# Patient Record
Sex: Female | Born: 1955 | Race: Black or African American | Hispanic: No | Marital: Single
Health system: Southern US, Community
[De-identification: ages and names within clinical notes are randomized; demographics above are authoritative.]

## PROBLEM LIST (undated history)

## (undated) DIAGNOSIS — E569 Vitamin deficiency, unspecified: Secondary | ICD-10-CM

## (undated) DIAGNOSIS — M79609 Pain in unspecified limb: Secondary | ICD-10-CM

## (undated) DIAGNOSIS — Z9889 Other specified postprocedural states: Secondary | ICD-10-CM

## (undated) DIAGNOSIS — Z8619 Personal history of other infectious and parasitic diseases: Secondary | ICD-10-CM

## (undated) DIAGNOSIS — M2550 Pain in unspecified joint: Secondary | ICD-10-CM

## (undated) DIAGNOSIS — B009 Herpesviral infection, unspecified: Secondary | ICD-10-CM

## (undated) DIAGNOSIS — E7800 Pure hypercholesterolemia, unspecified: Secondary | ICD-10-CM

## (undated) DIAGNOSIS — I83893 Varicose veins of bilateral lower extremities with other complications: Secondary | ICD-10-CM

## (undated) DIAGNOSIS — G4733 Obstructive sleep apnea (adult) (pediatric): Secondary | ICD-10-CM

## (undated) HISTORY — DX: Other specified postprocedural states: Z98.890

## (undated) HISTORY — DX: Obstructive sleep apnea (adult) (pediatric): G47.33

## (undated) HISTORY — PX: BUNIONECTOMY: SHX129

## (undated) HISTORY — DX: Herpesviral infection, unspecified: B00.9

## (undated) HISTORY — DX: Personal history of other infectious and parasitic diseases: Z86.19

## (undated) HISTORY — DX: Pain in unspecified joint: M25.50

## (undated) HISTORY — PX: BREAST SURGERY: SHX581

## (undated) HISTORY — PX: KNEE ARTHROSCOPY: SUR90

## (undated) HISTORY — DX: Varicose veins of bilateral lower extremities with other complications: I83.893

## (undated) HISTORY — DX: Pure hypercholesterolemia, unspecified: E78.00

## (undated) HISTORY — DX: Pain in unspecified limb: M79.609

## (undated) HISTORY — PX: CARPAL TUNNEL RELEASE: SHX101

## (undated) HISTORY — DX: Vitamin deficiency, unspecified: E56.9

---

## 2006-07-11 ENCOUNTER — Emergency Department (HOSPITAL_COMMUNITY): Admission: EM | Admit: 2006-07-11 | Discharge: 2006-07-12 | Payer: Self-pay | Admitting: Emergency Medicine

## 2009-06-20 ENCOUNTER — Encounter: Admission: RE | Admit: 2009-06-20 | Discharge: 2009-06-20 | Payer: Self-pay | Admitting: Family Medicine

## 2010-02-06 HISTORY — PX: HAND SURGERY: SHX662

## 2010-02-26 ENCOUNTER — Ambulatory Visit
Admission: RE | Admit: 2010-02-26 | Discharge: 2010-02-26 | Payer: Self-pay | Source: Home / Self Care | Attending: Orthopedic Surgery | Admitting: Orthopedic Surgery

## 2010-05-13 LAB — POCT HEMOGLOBIN-HEMACUE: Hemoglobin: 13.2 g/dL (ref 12.0–15.0)

## 2010-06-19 ENCOUNTER — Other Ambulatory Visit: Payer: Self-pay | Admitting: Family Medicine

## 2010-06-19 DIAGNOSIS — Z1231 Encounter for screening mammogram for malignant neoplasm of breast: Secondary | ICD-10-CM

## 2010-06-24 ENCOUNTER — Ambulatory Visit
Admission: RE | Admit: 2010-06-24 | Discharge: 2010-06-24 | Disposition: A | Discharge status: Home / Self Care | Payer: Federal, State, Local not specified - PPO | Source: Ambulatory Visit | Attending: Family Medicine | Admitting: Family Medicine

## 2010-06-24 DIAGNOSIS — Z1231 Encounter for screening mammogram for malignant neoplasm of breast: Secondary | ICD-10-CM

## 2010-11-08 ENCOUNTER — Encounter: Payer: Self-pay | Admitting: Vascular Surgery

## 2010-11-11 ENCOUNTER — Other Ambulatory Visit: Payer: Self-pay

## 2010-11-11 ENCOUNTER — Ambulatory Visit (INDEPENDENT_AMBULATORY_CARE_PROVIDER_SITE_OTHER): Payer: Federal, State, Local not specified - PPO | Admitting: Vascular Surgery

## 2010-11-11 ENCOUNTER — Encounter: Payer: Self-pay | Admitting: Vascular Surgery

## 2010-11-11 VITALS — BP 117/71 | HR 75 | Resp 20 | Ht 63.0 in | Wt 198.5 lb

## 2010-11-11 DIAGNOSIS — I83893 Varicose veins of bilateral lower extremities with other complications: Secondary | ICD-10-CM

## 2010-11-11 NOTE — Progress Notes (Signed)
Subjective:     Patient ID: Catherine Parker, female   DOB: 1955/09/12, 55 y.o.   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 She has previously had an evaluation many years ago in [place] with ultrasound studies which revealed reflux in both legs. She had some sclerotherapy performed at that time. She has had recurrence of extensive reticular and spider veins in both lower extremities left greater than right. These are mainly in her lateral thigh and calf areas tending down to the ankles. She developed swelling in both legs left worse than right as the day progresses. She also has an no history of thrombophlebitis DVT stasis ulcers bleeding or other complications. She has worn elastic compression stockings in the past without improvement but is not currently wearing them. Elevation helps her symptoms but she cannot do that at work as she is on her feet all day. She does not take pain medication. He describes her symptoms as a throbbing burning discomfort which worsens as the day progresses.  Past Medical History  Diagnosis Date  . Pain in limb   . Varicose veins of lower extremities with other complications   . Joint ache     History  Substance Use Topics  . Smoking status: Never Smoker   . Smokeless tobacco: Not on file  . Alcohol Use: Yes    History reviewed. No pertinent family history.  Allergies  Allergen Reactions  . Latex   . Penicillins     Current outpatient prescriptions:liver oil-zinc oxide (DESITIN) 40 % ointment, Apply topically as needed.  , Disp: , Rfl: ;  Vitamin D, Ergocalciferol, (DRISDOL) 50000 UNITS CAPS, Take 50,000 Units by mouth.  , Disp: , Rfl: ;  zolpidem (AMBIEN) 10 MG tablet, Take 10 mg by mouth at bedtime as needed.  , Disp: , Rfl:   BP 117/71  Pulse 75  Resp 20  Ht 5\' 3"  (1.6 m)  Wt 198 lb 8 oz (90.039 kg)  BMI 35.16 kg/m2  Body mass index is 35.16 kg/(m^2).         Review  of Systems she denies chest pain dyspnea on exertion PND orthopnea. She has had some weight gain as well as joint pain and leg discomfort with ambulation all other systems are negative the complete review of systems.     Objective:   Physical Exam blood pressure 170/71 heart rate 75 respirations 20 General she is well-developed well-nourished female no apparent distress HEENT exam normal for age Chest clear to ossicle patient no rhonchi or wheezing Cardiovascular regular rhythm no murmurs Abdomen soft nontender no masses palpable Neurologic exam normal Musculoskeletal from deformities Lower extremity exam reveals 3+ femoral popliteal dorsalis pedis pulses palpable bilaterally. She has diffuse pattern reticular veins particularly in the left lateral thigh and right lateral thigh extending posteriorly and distally down the lateral calf areas. She has 1+ edema bilaterally. There is no hyperpigmentation or ulceration noted.     Assessment:    evidence of severe bilateral venous insufficiency with diffuse reticular and spider veins and previous history of reflux.    Plan:    Will schedule bilateral venous reflux exam. We will look for valvular incompetence in the greater and small saphenous systems. If she does have reflux we will begin long-leg elastic compression stocking therapy (20 mm-30 mm gradient) We will also begin elevation of her legs when she is not working intermittently. She'll also begin ibuprofen therapy. Treatment will depend on the  findings of the venous duplex study to be done in the near future

## 2010-11-12 ENCOUNTER — Other Ambulatory Visit (INDEPENDENT_AMBULATORY_CARE_PROVIDER_SITE_OTHER): Payer: Federal, State, Local not specified - PPO | Admitting: *Deleted

## 2010-11-12 DIAGNOSIS — I83893 Varicose veins of bilateral lower extremities with other complications: Secondary | ICD-10-CM

## 2010-11-27 NOTE — Procedures (Unsigned)
LOWER EXTREMITY VENOUS REFLUX EXAM  INDICATION:  Varicose veins.  EXAM:  Using color-flow imaging and pulse Doppler spectral analysis, the bilateral common femoral, superficial femoral, popliteal, posterior tibial, greater and lesser saphenous veins are evaluated.  There is no evidence suggesting deep venous insufficiency in the bilateral lower extremities.  The left saphenofemoral junction is not competent with reflux of >532milliseconds. However, the remainder of the left great saphenous vein is competent.  The right GSV is competent.  The bilateral proximal short saphenous vein demonstrates competency.  GSV Diameter (used if found to be incompetent only)                                           Right    Left Proximal Greater Saphenous Vein           cm       cm Proximal-to-mid-thigh                     cm       cm Mid thigh                                 cm       cm Mid-distal thigh                          cm       cm Distal thigh                              cm       cm Knee                                      cm       cm  IMPRESSION: 1. Reflux of >500 milliseconds at the left saphenofemoral junction     with the remainder of the saphenous vein being competent. 2. No insufficiency observed in the right greater saphenous vein. 3. The deep venous system is competent. 4. The bilateral small saphenous veins are competent.  ___________________________________________ Quita Skye. Hart Rochester, M.D.  LT/MEDQ  D:  11/12/2010  T:  11/12/2010  Job:  [id number]

## 2011-06-16 ENCOUNTER — Other Ambulatory Visit: Payer: Self-pay | Admitting: Family Medicine

## 2011-06-16 DIAGNOSIS — Z1231 Encounter for screening mammogram for malignant neoplasm of breast: Secondary | ICD-10-CM

## 2011-06-26 ENCOUNTER — Ambulatory Visit
Admission: RE | Admit: 2011-06-26 | Discharge: 2011-06-26 | Disposition: A | Discharge status: Home / Self Care | Payer: Federal, State, Local not specified - PPO | Source: Ambulatory Visit | Attending: Family Medicine | Admitting: Family Medicine

## 2011-06-26 DIAGNOSIS — Z1231 Encounter for screening mammogram for malignant neoplasm of breast: Secondary | ICD-10-CM

## 2011-06-30 ENCOUNTER — Ambulatory Visit (INDEPENDENT_AMBULATORY_CARE_PROVIDER_SITE_OTHER): Payer: Federal, State, Local not specified - PPO | Admitting: Obstetrics and Gynecology

## 2011-06-30 ENCOUNTER — Encounter: Payer: Self-pay | Admitting: Obstetrics and Gynecology

## 2011-06-30 VITALS — BP 116/74 | HR 72 | Ht 63.0 in | Wt 203.0 lb

## 2011-06-30 DIAGNOSIS — I83893 Varicose veins of bilateral lower extremities with other complications: Secondary | ICD-10-CM

## 2011-06-30 DIAGNOSIS — B009 Herpesviral infection, unspecified: Secondary | ICD-10-CM | POA: Insufficient documentation

## 2011-06-30 DIAGNOSIS — M79609 Pain in unspecified limb: Secondary | ICD-10-CM

## 2011-06-30 DIAGNOSIS — M2550 Pain in unspecified joint: Secondary | ICD-10-CM

## 2011-06-30 DIAGNOSIS — Z124 Encounter for screening for malignant neoplasm of cervix: Secondary | ICD-10-CM

## 2011-06-30 DIAGNOSIS — Z01419 Encounter for gynecological examination (general) (routine) without abnormal findings: Secondary | ICD-10-CM

## 2011-06-30 NOTE — Patient Instructions (Signed)
Follow up with Dr. Zachery Dauer for fatigue and to have your vitamin D, thyroid and iron evaluated.

## 2011-06-30 NOTE — Progress Notes (Signed)
Subjective:    Catherine Parker is a 56 y.o. female, G4P0040, who presents for an annual exam. The patient complains of being tired all the time for the past several months.  Plans to see PCP soon.  Advised to have vitamin D checked.    History   Social History  . Marital Status: Single    Spouse Name: N/A    Number of Children: N/A  . Years of Education: N/A   Social History Main Topics  . Smoking status: Never Smoker   . Smokeless tobacco: Never Used  . Alcohol Use: Yes     socially  . Drug Use: No  . Sexually Active: Yes -- Female partner(s)    Birth Control/ Protection: IUD     MIRENA   Other Topics Concern  . None   Social History Narrative  . None    Last mammogram: was normal  2013 Last pap: was normal  2012  Menstrual cycle:   LMP: No LMP recorded. Patient is not currently having periods (Reason: IUD).             Review of Systems Pertinent items are noted in HPI. Denies pelvic pain, uti symptoms, vaginitis symptoms, irregular bleeding, menopausal symptoms or rectal bleeding   Objective:    BP 116/74  Pulse 72  Ht 5\' 3"  (1.6 m)  Wt 203 lb (92.08 kg)  BMI 35.96 kg/m2 Weight:  Wt Readings from Last 1 Encounters:  06/30/11 203 lb (92.08 kg)   BMI: Body mass index is 35.96 kg/(m^2). General Appearance: Alert, appropriate appearance for age. No acute distress HEENT: Grossly normal Neck / Thyroid: Supple, no masses, nodes or enlargement Lungs: clear to auscultation bilaterally Back: No CVA tenderness Breast Exam: No masses or nodes.No dimpling, nipple retraction or discharge. Cardiovascular: Regular rate and rhythm. S1, S2, no murmur Gastrointestinal: Soft, non-tender, no masses or organomegaly Pelvic Exam: External genitalia: normal general appearance Urinary system:  Cervix: normal appearance Adnexa: normal bimanual exam Uterus: normal single, nontender Rectal: good sphincter tone and no masses Rectovaginal: normal rectal, no masses Lymphatic  Exam: Non-palpable nodes in neck, clavicular, axillary, or inguinal regions Skin: no rash or abnormalities Extremities: no redness or tenderness in the calves or thighs, no edema negative Homan's  Neurologic: grossly normal Psychiatric: Alert and oriented, appropriate affect.   Wet Prep:not applicable Urinalysis:not applicable UPT: Not done   Assessment:    Normal gyn exam Fatigue    Plan:  Follow up with Dr. Zachery Dauer for evaluation of fatigue May order iron, vitamin D & thyroid studies  mammogram pap smear return annually or prn Prescription given for Boric Acid Capsules 600 mg #30  1 in vagina twice weekly prn  11 refills   Kemyra August,ELMIRAPA-C

## 2011-06-30 NOTE — Progress Notes (Signed)
Last Pap: 07/05/10 WNL: Yes Regular Periods:no IUD Monthly Breast exam:yes Tetanus<37yrs:no Nl.Bladder Function:yes Daily BMs:yes Healthy Diet:yes Calcium:yes Mammogram:yes  06/24/2011 Exercise:no Seatbelt: yes Abuse at home: no Stressful work:yes Sigmoid-colonoscopy:AGE 56    NL    BONE SCAN 10 YEARS AGO

## 2011-07-02 LAB — PAP IG W/ RFLX HPV ASCU

## 2011-07-21 ENCOUNTER — Other Ambulatory Visit: Payer: Self-pay | Admitting: Family Medicine

## 2011-07-21 DIAGNOSIS — M79669 Pain in unspecified lower leg: Secondary | ICD-10-CM

## 2011-07-22 ENCOUNTER — Ambulatory Visit
Admission: RE | Admit: 2011-07-22 | Discharge: 2011-07-22 | Disposition: A | Discharge status: Home / Self Care | Payer: Federal, State, Local not specified - PPO | Source: Ambulatory Visit | Attending: Family Medicine | Admitting: Family Medicine

## 2011-07-22 DIAGNOSIS — M79669 Pain in unspecified lower leg: Secondary | ICD-10-CM

## 2011-08-25 ENCOUNTER — Telehealth: Payer: Self-pay | Admitting: Obstetrics and Gynecology

## 2011-08-25 ENCOUNTER — Other Ambulatory Visit: Payer: Self-pay | Admitting: Obstetrics and Gynecology

## 2011-08-25 NOTE — Telephone Encounter (Signed)
Triage.

## 2011-08-25 NOTE — Telephone Encounter (Signed)
TC TO PT REGARDING MSG, LM ON VM TO CALL BACK.

## 2011-08-26 ENCOUNTER — Other Ambulatory Visit: Payer: Self-pay | Admitting: Obstetrics and Gynecology

## 2011-08-26 MED ORDER — AMBULATORY NON FORMULARY MEDICATION: 1.0000 | Status: DC

## 2011-08-26 NOTE — Telephone Encounter (Signed)
PT RTND CALL, STATES LOST RX THAT WAS GIVEN TO HER IN 06/2011 FOR HER BORIC ACID SUPPOSITORIES, ?'S IF IT CAN BE CALLED IN TO [facility], INFORMED SHOULD NOT BE A PROBLEM, WILL MAKE EP AWARE.

## 2012-02-11 ENCOUNTER — Encounter: Payer: Federal, State, Local not specified - PPO | Admitting: Obstetrics and Gynecology

## 2012-02-13 ENCOUNTER — Encounter: Payer: Self-pay | Admitting: Obstetrics and Gynecology

## 2012-02-13 ENCOUNTER — Ambulatory Visit (INDEPENDENT_AMBULATORY_CARE_PROVIDER_SITE_OTHER): Payer: Federal, State, Local not specified - PPO | Admitting: Obstetrics and Gynecology

## 2012-02-13 VITALS — BP 120/80 | Ht 63.0 in

## 2012-02-13 DIAGNOSIS — Z124 Encounter for screening for malignant neoplasm of cervix: Secondary | ICD-10-CM | POA: Insufficient documentation

## 2012-02-13 DIAGNOSIS — N898 Other specified noninflammatory disorders of vagina: Secondary | ICD-10-CM

## 2012-02-13 LAB — POCT WET PREP (WET MOUNT)
Bacteria Wet Prep HPF POC: NEGATIVE
KOH Wet Prep POC: NEGATIVE
Trichomonas Wet Prep HPF POC: NEGATIVE
WBC, Wet Prep HPF POC: NEGATIVE

## 2012-02-13 NOTE — Progress Notes (Signed)
S: pt worried about vag d/c, also concerned about being told she had herpes, but doesn't know what the lesion is, and worried about passing it to her partner  O: wet prep neg, normal vaginal flora No evidence of herpes lesion  A: normal gyn exam  P: discussed w pt, difference b/w herpes and other skin problems Reassured pt about normal discharge RTO prn or for annual exam

## 2012-02-19 ENCOUNTER — Ambulatory Visit
Admission: RE | Admit: 2012-02-19 | Discharge: 2012-02-19 | Disposition: A | Discharge status: Home / Self Care | Payer: Federal, State, Local not specified - PPO | Source: Ambulatory Visit | Attending: Family Medicine | Admitting: Family Medicine

## 2012-02-19 ENCOUNTER — Other Ambulatory Visit: Payer: Self-pay | Admitting: Family Medicine

## 2012-02-19 DIAGNOSIS — M79606 Pain in leg, unspecified: Secondary | ICD-10-CM

## 2012-02-19 DIAGNOSIS — M7989 Other specified soft tissue disorders: Secondary | ICD-10-CM

## 2012-03-02 ENCOUNTER — Ambulatory Visit (INDEPENDENT_AMBULATORY_CARE_PROVIDER_SITE_OTHER): Payer: Federal, State, Local not specified - PPO | Admitting: Obstetrics and Gynecology

## 2012-03-02 ENCOUNTER — Telehealth: Payer: Self-pay | Admitting: Obstetrics and Gynecology

## 2012-03-02 ENCOUNTER — Encounter: Payer: Self-pay | Admitting: Obstetrics and Gynecology

## 2012-03-02 VITALS — BP 120/84 | Resp 14 | Wt 205.0 lb

## 2012-03-02 DIAGNOSIS — L989 Disorder of the skin and subcutaneous tissue, unspecified: Secondary | ICD-10-CM

## 2012-03-02 DIAGNOSIS — B009 Herpesviral infection, unspecified: Secondary | ICD-10-CM

## 2012-03-02 NOTE — Telephone Encounter (Signed)
Tc to pt regarding message. Pt states she have a rash on her behind and want to be evaluated. Pt question if it is a hsv outbreak. Appt scheduled today with VL. Pt voiced understanding.

## 2012-03-03 NOTE — Progress Notes (Signed)
Here for evaluation of "rash on bottom".  Started several days ago--not painful. 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  Possible outbreaks occasionally since then, per hard chart, but patient doesn't remember those occurences. Seen 12/13 by SL for vaginal d/c--had questions at that time about HSV history and usual course of disease. Has a partner, but "no activity since I found out I had herpes".  Admits to being obsessive about cleanliness.  PE: Pustular lesions on right perineum/inner thigh.  NT. HSV culture done.  Imp:  Likely HSV outbreak  Plan: Patient to start Valtrex 500 mg po BID x 3 day course (already has medication at home). Reviewed s/s of HSV prodrome and lesions--if patient experiences more frequent outbreaks, can start suppressive therapy with Valtrex 500 po q day. Long discussion with patient about HSV, safe sex behaviors, etc.   F/u prn.

## 2012-03-05 ENCOUNTER — Telehealth: Payer: Self-pay

## 2012-03-05 NOTE — Telephone Encounter (Signed)
Spoke with pt regarding +HSV 2 test results. Pt is currently taking Valtrex 500 mg po bid for 3 days. Pt needs another Valtrex rx. Valtrex called to [facility] on [address] until next AEX per protocol. Pt doesn't want generic. Pt agrees and voices understanding.

## 2012-03-09 ENCOUNTER — Other Ambulatory Visit: Payer: Self-pay | Admitting: Orthopedic Surgery

## 2012-03-09 ENCOUNTER — Encounter (HOSPITAL_BASED_OUTPATIENT_CLINIC_OR_DEPARTMENT_OTHER): Payer: Self-pay | Admitting: *Deleted

## 2012-03-09 NOTE — Progress Notes (Signed)
No labs needed

## 2012-03-12 ENCOUNTER — Ambulatory Visit (HOSPITAL_BASED_OUTPATIENT_CLINIC_OR_DEPARTMENT_OTHER)
Admission: RE | Admit: 2012-03-12 | Discharge: 2012-03-12 | Disposition: A | Discharge status: Home / Self Care | Payer: Federal, State, Local not specified - PPO | Source: Ambulatory Visit | Attending: Orthopedic Surgery | Admitting: Orthopedic Surgery

## 2012-03-12 ENCOUNTER — Encounter (HOSPITAL_BASED_OUTPATIENT_CLINIC_OR_DEPARTMENT_OTHER): Payer: Self-pay | Admitting: Anesthesiology

## 2012-03-12 ENCOUNTER — Ambulatory Visit (HOSPITAL_BASED_OUTPATIENT_CLINIC_OR_DEPARTMENT_OTHER): Payer: Federal, State, Local not specified - PPO | Admitting: Anesthesiology

## 2012-03-12 ENCOUNTER — Encounter (HOSPITAL_BASED_OUTPATIENT_CLINIC_OR_DEPARTMENT_OTHER)
Admission: RE | Disposition: A | Discharge status: Home / Self Care | Payer: Self-pay | Source: Ambulatory Visit | Attending: Orthopedic Surgery

## 2012-03-12 ENCOUNTER — Encounter (HOSPITAL_BASED_OUTPATIENT_CLINIC_OR_DEPARTMENT_OTHER): Payer: Self-pay | Admitting: *Deleted

## 2012-03-12 DIAGNOSIS — Z880 Allergy status to penicillin: Secondary | ICD-10-CM | POA: Insufficient documentation

## 2012-03-12 DIAGNOSIS — E7800 Pure hypercholesterolemia, unspecified: Secondary | ICD-10-CM | POA: Insufficient documentation

## 2012-03-12 DIAGNOSIS — M2240 Chondromalacia patellae, unspecified knee: Secondary | ICD-10-CM | POA: Insufficient documentation

## 2012-03-12 DIAGNOSIS — Z91013 Allergy to seafood: Secondary | ICD-10-CM | POA: Insufficient documentation

## 2012-03-12 DIAGNOSIS — Z8249 Family history of ischemic heart disease and other diseases of the circulatory system: Secondary | ICD-10-CM | POA: Insufficient documentation

## 2012-03-12 DIAGNOSIS — M23329 Other meniscus derangements, posterior horn of medial meniscus, unspecified knee: Secondary | ICD-10-CM | POA: Insufficient documentation

## 2012-03-12 DIAGNOSIS — Z833 Family history of diabetes mellitus: Secondary | ICD-10-CM | POA: Insufficient documentation

## 2012-03-12 DIAGNOSIS — M23349 Other meniscus derangements, anterior horn of lateral meniscus, unspecified knee: Secondary | ICD-10-CM | POA: Insufficient documentation

## 2012-03-12 DIAGNOSIS — Z91040 Latex allergy status: Secondary | ICD-10-CM | POA: Insufficient documentation

## 2012-03-12 DIAGNOSIS — Z809 Family history of malignant neoplasm, unspecified: Secondary | ICD-10-CM | POA: Insufficient documentation

## 2012-03-12 HISTORY — PX: KNEE ARTHROSCOPY WITH MEDIAL MENISECTOMY: SHX5651

## 2012-03-12 HISTORY — PX: CHONDROPLASTY: SHX5177

## 2012-03-12 HISTORY — PX: KNEE ARTHROSCOPY WITH LATERAL MENISECTOMY: SHX6193

## 2012-03-12 SURGERY — ARTHROSCOPY, KNEE, WITH LATERAL MENISCECTOMY
Anesthesia: General | Site: Knee | Laterality: Left | Wound class: Clean

## 2012-03-12 MED ORDER — SODIUM CHLORIDE 0.9 % IR SOLN
Status: DC | PRN
  Administered 2012-03-12: 10:00:00

## 2012-03-12 MED ORDER — POVIDONE-IODINE 7.5 % EX SOLN: Freq: Once | CUTANEOUS | Status: DC

## 2012-03-12 MED ORDER — FENTANYL CITRATE 0.05 MG/ML IJ SOLN
INTRAMUSCULAR | Status: DC | PRN
  Administered 2012-03-12: 25 ug via INTRAVENOUS
  Administered 2012-03-12: 50 ug via INTRAVENOUS
  Administered 2012-03-12: 25 ug via INTRAVENOUS

## 2012-03-12 MED ORDER — ACETAMINOPHEN 10 MG/ML IV SOLN
1000.0000 mg | Freq: Once | INTRAVENOUS | Status: AC
  Administered 2012-03-12: 1000 mg via INTRAVENOUS

## 2012-03-12 MED ORDER — HYDROMORPHONE HCL PF 1 MG/ML IJ SOLN
0.2500 mg | INTRAMUSCULAR | Status: DC | PRN
  Administered 2012-03-12 (×2): 0.5 mg via INTRAVENOUS
  Administered 2012-03-12: 0.25 mg via INTRAVENOUS
  Administered 2012-03-12: 0.5 mg via INTRAVENOUS

## 2012-03-12 MED ORDER — ONDANSETRON HCL 4 MG/2ML IJ SOLN
INTRAMUSCULAR | Status: DC | PRN
  Administered 2012-03-12: 4 mg via INTRAVENOUS

## 2012-03-12 MED ORDER — OXYCODONE HCL 5 MG/5ML PO SOLN: 5.0000 mg | Freq: Once | ORAL | Status: DC | PRN

## 2012-03-12 MED ORDER — PROPOFOL 10 MG/ML IV BOLUS
INTRAVENOUS | Status: DC | PRN
  Administered 2012-03-12 (×2): 150 mg via INTRAVENOUS

## 2012-03-12 MED ORDER — LACTATED RINGERS IV SOLN
INTRAVENOUS | Status: DC
  Administered 2012-03-12: 09:00:00 via INTRAVENOUS

## 2012-03-12 MED ORDER — LIDOCAINE HCL (CARDIAC) 20 MG/ML IV SOLN
INTRAVENOUS | Status: DC | PRN
  Administered 2012-03-12: 100 mg via INTRAVENOUS

## 2012-03-12 MED ORDER — OXYCODONE HCL 5 MG PO TABS: 5.0000 mg | ORAL_TABLET | Freq: Once | ORAL | Status: DC | PRN

## 2012-03-12 MED ORDER — DEXAMETHASONE SODIUM PHOSPHATE 4 MG/ML IJ SOLN
INTRAMUSCULAR | Status: DC | PRN
  Administered 2012-03-12: 10 mg via INTRAVENOUS

## 2012-03-12 MED ORDER — MIDAZOLAM HCL 5 MG/5ML IJ SOLN
INTRAMUSCULAR | Status: DC | PRN
  Administered 2012-03-12: 1 mg via INTRAVENOUS

## 2012-03-12 MED ORDER — CLINDAMYCIN PHOSPHATE 900 MG/50ML IV SOLN
900.0000 mg | INTRAVENOUS | Status: AC
  Administered 2012-03-12 (×2): 900 mg via INTRAVENOUS

## 2012-03-12 MED ORDER — BUPIVACAINE HCL (PF) 0.5 % IJ SOLN
INTRAMUSCULAR | Status: DC | PRN
  Administered 2012-03-12: 20 mL

## 2012-03-12 MED ORDER — OXYCODONE-ACETAMINOPHEN 5-325 MG PO TABS: 1.0000 | ORAL_TABLET | Freq: Four times a day (QID) | ORAL | Status: DC | PRN

## 2012-03-12 SURGICAL SUPPLY — 43 items
BANDAGE ELASTIC 6 VELCRO ST LF (GAUZE/BANDAGES/DRESSINGS) ×2 IMPLANT
BLADE 4.2CUDA (BLADE) IMPLANT
BLADE GREAT WHITE 4.2 (BLADE) ×2 IMPLANT
CANISTER OMNI JUG 16 LITER (MISCELLANEOUS) ×2 IMPLANT
CANISTER SUCTION 2500CC (MISCELLANEOUS) IMPLANT
CLOTH BEACON ORANGE TIMEOUT ST (SAFETY) ×2 IMPLANT
CUTTER MENISCUS  4.2MM (BLADE)
CUTTER MENISCUS 4.2MM (BLADE) IMPLANT
DRAPE ARTHROSCOPY W/POUCH 114 (DRAPES) ×2 IMPLANT
DRSG EMULSION OIL 3X3 NADH (GAUZE/BANDAGES/DRESSINGS) ×2 IMPLANT
DURAPREP 26ML APPLICATOR (WOUND CARE) ×2 IMPLANT
ELECT MENISCUS 165MM 90D (ELECTRODE) IMPLANT
ELECT REM PT RETURN 9FT ADLT (ELECTROSURGICAL) ×2
ELECTRODE REM PT RTRN 9FT ADLT (ELECTROSURGICAL) IMPLANT
GLOVE BIOGEL PI IND STRL 8 (GLOVE) ×2 IMPLANT
GLOVE BIOGEL PI INDICATOR 8 (GLOVE)
GLOVE ECLIPSE 7.5 STRL STRAW (GLOVE) ×2 IMPLANT
GLOVE SKINSENSE NS SZ6.5 (GLOVE) ×1
GLOVE SKINSENSE NS SZ7.0 (GLOVE) ×1
GLOVE SKINSENSE NS SZ7.5 (GLOVE) ×3
GLOVE SKINSENSE STRL SZ6.5 (GLOVE) IMPLANT
GLOVE SKINSENSE STRL SZ7.0 (GLOVE) IMPLANT
GLOVE SKINSENSE STRL SZ7.5 (GLOVE) IMPLANT
GOWN BRE IMP PREV XXLGXLNG (GOWN DISPOSABLE) ×2 IMPLANT
GOWN PREVENTION PLUS XLARGE (GOWN DISPOSABLE) ×2 IMPLANT
GOWN PREVENTION PLUS XXLARGE (GOWN DISPOSABLE) ×4 IMPLANT
HOLDER KNEE FOAM BLUE (MISCELLANEOUS) ×2 IMPLANT
KNEE WRAP E Z 3 GEL PACK (MISCELLANEOUS) ×2 IMPLANT
NDL SAFETY ECLIPSE 18X1.5 (NEEDLE) IMPLANT
NEEDLE HYPO 18GX1.5 SHARP (NEEDLE)
PACK ARTHROSCOPY DSU (CUSTOM PROCEDURE TRAY) ×2 IMPLANT
PACK BASIN DAY SURGERY FS (CUSTOM PROCEDURE TRAY) ×2 IMPLANT
PAD CAST 4YDX4 CTTN HI CHSV (CAST SUPPLIES) ×1 IMPLANT
PADDING CAST COTTON 4X4 STRL (CAST SUPPLIES) ×2
PENCIL BUTTON HOLSTER BLD 10FT (ELECTRODE) IMPLANT
SET ARTHROSCOPY TUBING (MISCELLANEOUS) ×2
SET ARTHROSCOPY TUBING LN (MISCELLANEOUS) ×1 IMPLANT
SPONGE GAUZE 4X4 12PLY (GAUZE/BANDAGES/DRESSINGS) ×2 IMPLANT
SUT ETHILON 4 0 PS 2 18 (SUTURE) IMPLANT
SYR 5ML LL (SYRINGE) ×2 IMPLANT
TOWEL OR 17X24 6PK STRL BLUE (TOWEL DISPOSABLE) ×2 IMPLANT
TOWEL OR NON WOVEN STRL DISP B (DISPOSABLE) IMPLANT
WATER STERILE IRR 1000ML POUR (IV SOLUTION) ×2 IMPLANT

## 2012-03-12 NOTE — Anesthesia Postprocedure Evaluation (Signed)
  Anesthesia Post-op Note  Patient: Doloris Amon  Procedure(s) Performed: Procedure(s) (LRB) with comments: KNEE ARTHROSCOPY WITH LATERAL MENISECTOMY (Left) KNEE ARTHROSCOPY WITH MEDIAL MENISECTOMY (Left) CHONDROPLASTY (Left)  Patient Location: PACU  Anesthesia Type:General  Level of Consciousness: awake and alert   Airway and Oxygen Therapy: Patient Spontanous Breathing and Patient connected to face mask oxygen  Post-op Pain: mild  Post-op Assessment: Post-op Vital signs reviewed, Patient's Cardiovascular Status Stable, Respiratory Function Stable, Patent Airway and No signs of Nausea or vomiting  Post-op Vital Signs: Reviewed and stable  Complications: No apparent anesthesia complications

## 2012-03-12 NOTE — Anesthesia Preprocedure Evaluation (Addendum)
Anesthesia Evaluation Anesthesia Physical Anesthesia Plan Anesthesia Quick Evaluation  

## 2012-03-12 NOTE — Brief Op Note (Signed)
03/12/2012  10:30 AM  PATIENT:  Catherine Parker  57 y.o. 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  PROCEDURE:  Procedure(s) (LRB) with comments: KNEE ARTHROSCOPY WITH LATERAL MENISECTOMY (Left) KNEE ARTHROSCOPY WITH MEDIAL MENISECTOMY (Left) CHONDROPLASTY (Left)  SURGEON:  Surgeon(s) and Role:    * Harvie Junior, MD - Primary  PHYSICIAN ASSISTANT:   ASSISTANTS: bethune   ANESTHESIA:   general  EBL:  Total I/O In: 850 [I.V.:850] Out: -   BLOOD ADMINISTERED:none  DRAINS: none   LOCAL MEDICATIONS USED:  MARCAINE     SPECIMEN:  No Specimen  DISPOSITION OF SPECIMEN:  N/A  COUNTS:  YES  TOURNIQUET:  * No tourniquets in log *  DICTATION: .Other Dictation: Dictation Number [id number]  PLAN OF CARE: d/c home after pacu  PATIENT DISPOSITION:  PACU - hemodynamically stable.   Delay start of Pharmacological VTE agent (>24hrs) due to surgical blood loss or risk of bleeding: no

## 2012-03-12 NOTE — Anesthesia Procedure Notes (Addendum)
Procedure Name: LMA Insertion Date/Time: 03/12/2012 9:52 AM Performed by: Meyer Russel Pre-anesthesia Checklist: Patient identified, Emergency Drugs available, Suction available and Patient being monitored Patient Re-evaluated:Patient Re-evaluated prior to inductionOxygen Delivery Method: Circle System Utilized Preoxygenation: Pre-oxygenation with 100% oxygen Intubation Type: IV induction Ventilation: Mask ventilation without difficulty LMA: LMA inserted LMA Size: 4.0 Number of attempts: 1 Airway Equipment and Method: bite block Placement Confirmation: positive ETCO2 and breath sounds checked- equal and bilateral (Central incisor noted as chipped prior to placement of LMA.) Tube secured with: Tape Dental Injury: Teeth and Oropharynx as per pre-operative assessment

## 2012-03-12 NOTE — Transfer of Care (Signed)
Immediate Anesthesia Transfer of Care Note  Patient: Catherine Parker  Procedure(s) Performed: Procedure(s) (LRB) with comments: KNEE ARTHROSCOPY WITH LATERAL MENISECTOMY (Left) KNEE ARTHROSCOPY WITH MEDIAL MENISECTOMY (Left) CHONDROPLASTY (Left)  Patient Location: PACU  Anesthesia Type:General  Level of Consciousness: awake, alert  and oriented  Airway & Oxygen Therapy: Patient Spontanous Breathing and Patient connected to face mask oxygen  Post-op Assessment: Report given to PACU RN, Post -op Vital signs reviewed and stable and Patient moving all extremities  Post vital signs: Reviewed and stable  Complications: No apparent anesthesia complications

## 2012-03-12 NOTE — H&P (Signed)
PREOPERATIVE H&P  Chief Complaint: l knee pain  HPI: Catherine Parker is a 57 y.o. female who presents for evaluation of l knee pain. It has been present for greater than 6 months and has been worsening. She has failed conservative measures. Pain is rated as moderate.  Past Medical History  Diagnosis Date  . Pain in limb   . Varicose veins of lower extremities with other complications   . Joint ache   . H/O herpes simplex type 2 infection   . Hx of bilateral breast reduction surgery   . High cholesterol   . Herpes simplex without mention of complication    Past Surgical History  Procedure Date  . Carpal tunnel release   . Knee arthroscopy   . Bunionectomy   . Breast surgery     breast reduction-bilat   History   Social History  . Marital Status: Single    Spouse Name: N/A    Number of Children: N/A  . Years of Education: N/A   Social History Main Topics  . Smoking status: Never Smoker   . Smokeless tobacco: Never Used  . Alcohol Use: Yes     Comment: socially  . Drug Use: No  . Sexually Active: Yes -- Female partner(s)    Birth Control/ Protection: IUD     Comment: MIRENA   Other Topics Concern  . None   Social History Narrative  . None   Family History  Problem Relation Age of Onset  . Diabetes Mother   . Heart disease Mother   . Diabetes Father   . Heart disease Father   . Diabetes Sister   . Diabetes Maternal Aunt   . Heart disease Maternal Uncle   . Cancer Paternal Aunt   . Heart disease Maternal Grandmother   . Heart disease Maternal Grandfather   . Diabetes Sister   . Alcohol abuse Paternal Aunt    Allergies  Allergen Reactions  . Latex   . Penicillins   . Shellfish Allergy Hives   Prior to Admission medications   Medication Sig Start Date End Date Taking? Authorizing Provider  Biotin 5 MG CAPS Take by mouth.   Yes Historical Provider, MD  calcium carbonate (OS-CAL) 600 MG TABS Take 600 mg by mouth 2 (two) times daily with a meal.    Yes Historical Provider, MD  Cyclobenzaprine HCl (FLEXERIL PO) Take by mouth.   Yes Historical Provider, MD  fish oil-omega-3 fatty acids 1000 MG capsule Take 2 g by mouth daily.   Yes Historical Provider, MD  ibuprofen (ADVIL,MOTRIN) 800 MG tablet Take 800 mg by mouth every 8 (eight) hours as needed.   Yes Historical Provider, MD  Vitamin D, Ergocalciferol, (DRISDOL) 50000 UNITS CAPS Take 50,000 Units by mouth.     Yes Historical Provider, MD  zolpidem (AMBIEN) 10 MG tablet Take 10 mg by mouth at bedtime as needed.     Yes Historical Provider, MD  Multiple Vitamin (MULTIVITAMIN) tablet Take 1 tablet by mouth daily.    Historical Provider, MD  TRAMADOL HCL PO Take by mouth.    Historical Provider, MD     Positive ROS: none  All other systems have been reviewed and were otherwise negative with the exception of those mentioned in the HPI and as above.  Physical Exam: Filed Vitals:   03/12/12 0831  BP: 133/88  Pulse: 76  Temp: 97.8 F (36.6 C)  Resp: 20    General: Alert, no acute distress Cardiovascular: No pedal edema  Respiratory: No cyanosis, no use of accessory musculature GI: No organomegaly, abdomen is soft and non-tender Skin: No lesions in the area of chief complaint Neurologic: Sensation intact distally Psychiatric: Patient is competent for consent with normal mood and affect Lymphatic: No axillary or cervical lymphadenopathy  MUSCULOSKELETAL: l knee +TTP over med side - instability  Assessment/Plan: MEDIAL AND LATERAL MENISCUS TEAR LEFT KNEE  Plan for Procedure(s): ARTHROSCOPY KNEE left side  The risks benefits and alternatives were discussed with the patient including but not limited to the risks of nonoperative treatment, versus surgical intervention including infection, bleeding, nerve injury, malunion, nonunion, hardware prominence, hardware failure, need for hardware removal, blood clots, cardiopulmonary complications, morbidity, mortality, among others, and they  were willing to proceed.  Predicted outcome is good, although there will be at least a six to nine month expected recovery.  Nazarene Bunning L, MD 03/12/2012 9:34 AM

## 2012-03-15 ENCOUNTER — Encounter (HOSPITAL_BASED_OUTPATIENT_CLINIC_OR_DEPARTMENT_OTHER): Payer: Self-pay | Admitting: Orthopedic Surgery

## 2012-03-15 NOTE — Op Note (Signed)
NAMEMARICE, GUIDONE NO.:  [id number]  MEDICAL RECORD NO.:  [id number]  LOCATION:                                 FACILITY:  PHYSICIAN:  Harvie Junior, M.D.   DATE OF BIRTH:  Sep 10, 1955  DATE OF PROCEDURE:  03/12/2012 DATE OF DISCHARGE:                              OPERATIVE REPORT   PREOPERATIVE DIAGNOSIS:  Medial and lateral meniscus tears.  POSTOPERATIVE DIAGNOSES: 1. Medial and lateral meniscus tears. 2. Chondromalacia of the patellofemoral joint.  PROCEDURE: 1. Left knee arthroscopy with debridement of medial and lateral     meniscal tears. 2. Chondroplasty of the patellofemoral joint.  SURGEON:  Harvie Junior, M.D.  ASSISTANT:  Marshia Ly, PA-C  ANESTHESIA:  General.  BRIEF HISTORY:  Ms. Quint is a 57 year old female with history of having significant complaints of left knee pain.  We treated conservatively for a period of time with activity modification, injection therapy.  MRI was obtained, which showed that she had a medial meniscal tear and lateral meniscal tear and patellofemoral chondromalacia.  After failure of all conservative care, she was taken to the operating room for operative knee arthroscopy.  PROCEDURE:  The patient was taken to the operating room.  After adequate anesthesia was obtained with a general anesthetic, the patient was placed supine on the operating table.  The left leg was then prepped and draped in usual sterile fashion.  Following this, the leg was entered arthroscopically and there was noted to be some chondromalacia of the patellofemoral joint, was debrided back to a smooth and stable rim. Attention was turned to the medial side, where there was a posterior horn of medial meniscal tear which was debrided back to a smooth and stable rim.  Attention was then turned laterally, where an anterior horn lateral meniscal tear was debrided back to a smooth and stable rim. Articular cartilage on the lateral  side looked pretty good.  There was a little bit of an area on the tibial plateau, but not bad.  On the medial side, there was chondromalacia of grade 2, and at this point, the attention was turned back up to the patellofemoral joint were she was smoothed off again in the patellofemoral joint.  At this point, the knee was copiously and thoroughly lavaged, suctioned dry.  The knee was then copiously irrigated with 6 L of normal saline irrigation and suctioned dry.  The arthroscopic portals were closed with a bandage.  A sterile compressive dressing was applied, and the patient was taken to the recovery and she was noted to be in satisfactory condition.  The estimated blood loss for the procedure was minimal.     Harvie Junior, M.D.     Ranae Plumber  D:  03/12/2012  T:  03/12/2012  Job:  [id number]

## 2012-03-15 NOTE — Addendum Note (Signed)
Addendum  created 03/15/12 1136 by Lance Coon, CRNA   Modules edited:Charges VN

## 2012-05-03 ENCOUNTER — Encounter: Payer: Self-pay | Admitting: Obstetrics and Gynecology

## 2012-05-03 ENCOUNTER — Ambulatory Visit: Payer: Federal, State, Local not specified - PPO | Admitting: Obstetrics and Gynecology

## 2012-05-03 VITALS — BP 118/70 | HR 78 | Wt 211.0 lb

## 2012-05-03 DIAGNOSIS — Z309 Encounter for contraceptive management, unspecified: Secondary | ICD-10-CM

## 2012-05-03 DIAGNOSIS — N390 Urinary tract infection, site not specified: Secondary | ICD-10-CM

## 2012-05-03 DIAGNOSIS — R319 Hematuria, unspecified: Secondary | ICD-10-CM

## 2012-05-03 LAB — POCT URINALYSIS DIPSTICK
Blood, UA: >4
Glucose, UA: NEGATIVE
Ketones, UA: NEGATIVE
Protein, UA: NEGATIVE
Spec Grav, UA: 1.015

## 2012-05-03 LAB — POCT URINE PREGNANCY: Preg Test, Ur: NEGATIVE

## 2012-05-03 MED ORDER — VALACYCLOVIR HCL 500 MG PO TABS: ORAL_TABLET | ORAL | Status: DC

## 2012-05-03 MED ORDER — CIPROFLOXACIN HCL 500 MG PO TABS: 500.0000 mg | ORAL_TABLET | Freq: Two times a day (BID) | ORAL | Status: AC

## 2012-05-03 NOTE — Progress Notes (Addendum)
57 YO for IUD removal and also reports that she noticed blood when she wiped after urination on two occasions.  Recently returned to work due to arthroscopic left knee surgery.   Denies dysuria, flank pain, bowel symptoms or fever but does have frequency.   O: Abdomen: soft, non-tender without masses      Pelvic: EGBUS-wnl, vagina-normal, cervix-IUD strings visible, no lesions and without tenderness, uterus-normal size and adnexae-no tenderness or masses  UA: ph-5.0,  SG-1.015  blood > 4 UPT-negative  A: UTI?  Hematuria  P: urine for culture      Cipro 500 mg  #30 1 po bid x 3 days prn 2 refills      RTO-IUD removal (patient wants to wait until after her cruise  POWELL,ELMIRA, PA-C

## 2012-06-25 ENCOUNTER — Other Ambulatory Visit: Payer: Self-pay

## 2012-06-25 DIAGNOSIS — Z1231 Encounter for screening mammogram for malignant neoplasm of breast: Secondary | ICD-10-CM

## 2012-06-28 ENCOUNTER — Ambulatory Visit
Admission: RE | Admit: 2012-06-28 | Discharge: 2012-06-28 | Disposition: A | Discharge status: Home / Self Care | Payer: Federal, State, Local not specified - PPO | Source: Ambulatory Visit

## 2012-06-28 DIAGNOSIS — Z1231 Encounter for screening mammogram for malignant neoplasm of breast: Secondary | ICD-10-CM

## 2013-06-22 ENCOUNTER — Other Ambulatory Visit: Payer: Self-pay

## 2013-06-22 DIAGNOSIS — Z1231 Encounter for screening mammogram for malignant neoplasm of breast: Secondary | ICD-10-CM

## 2013-06-28 ENCOUNTER — Ambulatory Visit
Admission: RE | Admit: 2013-06-28 | Discharge: 2013-06-28 | Disposition: A | Discharge status: Home / Self Care | Payer: Federal, State, Local not specified - PPO | Source: Ambulatory Visit

## 2013-06-28 DIAGNOSIS — Z1231 Encounter for screening mammogram for malignant neoplasm of breast: Secondary | ICD-10-CM

## 2013-06-29 ENCOUNTER — Ambulatory Visit: Payer: Federal, State, Local not specified - PPO

## 2014-01-02 ENCOUNTER — Encounter: Payer: Self-pay | Admitting: Obstetrics and Gynecology

## 2014-05-04 IMAGING — US US EXTREM LOW VENOUS*L*
1 series · 14 of 24 positions shown · non-contrast
Comparison: None.

CLINICAL DATA: Left leg pain and swelling.

LEFT LOWER EXTREMITY VENOUS DUPLEX ULTRASOUND
TECHNIQUE: Gray-scale sonography with graded compression, as well
as color Doppler and duplex ultrasound, were performed to evaluate
the deep venous system of the lower extremity from the level of the
common femoral vein through the popliteal and proximal calf veins.
Spectral Doppler was utilized to evaluate flow at rest and with
distal augmentation maneuvers.

[Series 1: us extrem low venous*left* · 14 of 32 slices shown]
[im 1/32]
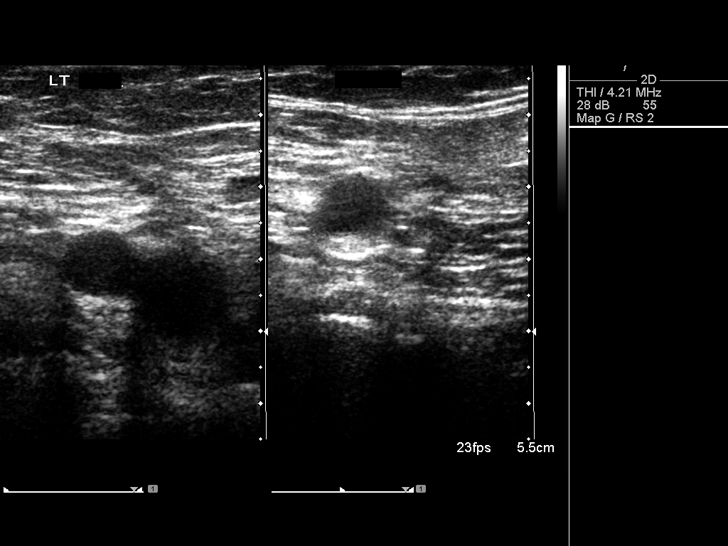
[im 3/32]
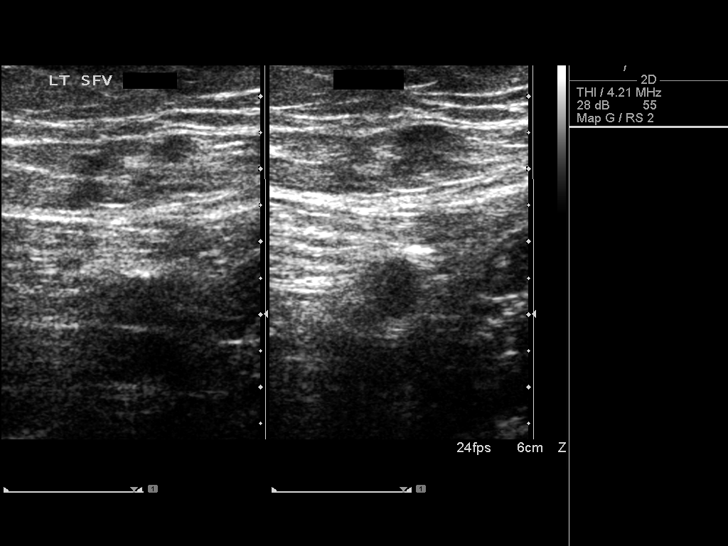
[im 6/32]
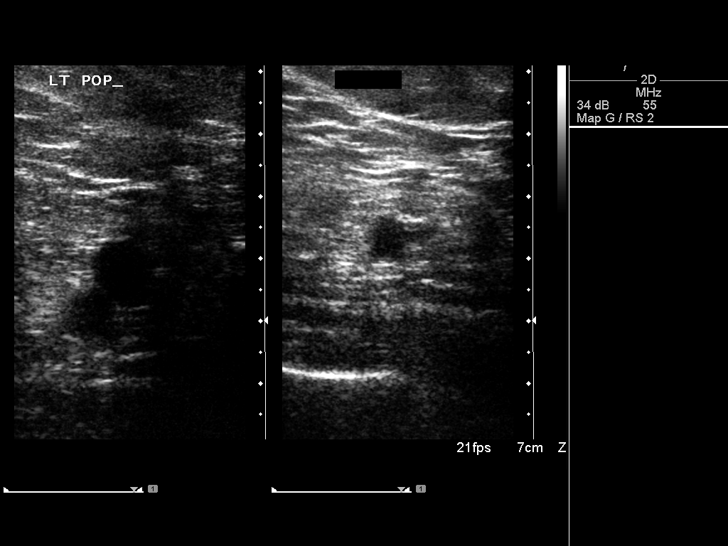
[im 9/32]
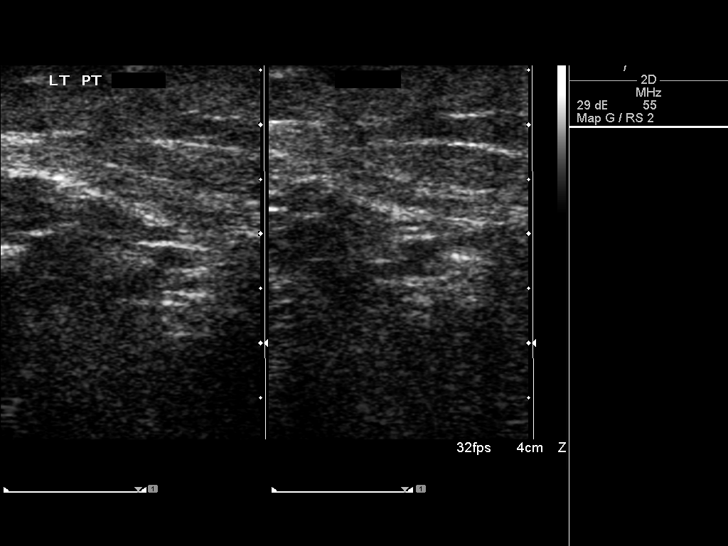
[im 10/32]
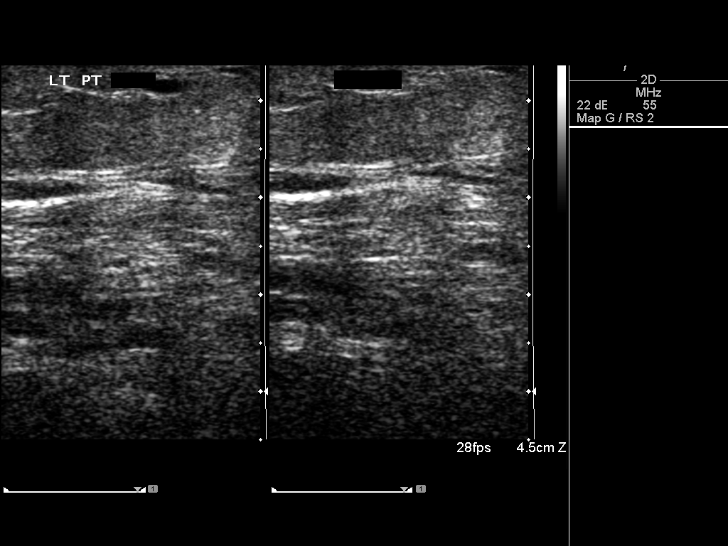
[im 13/32]
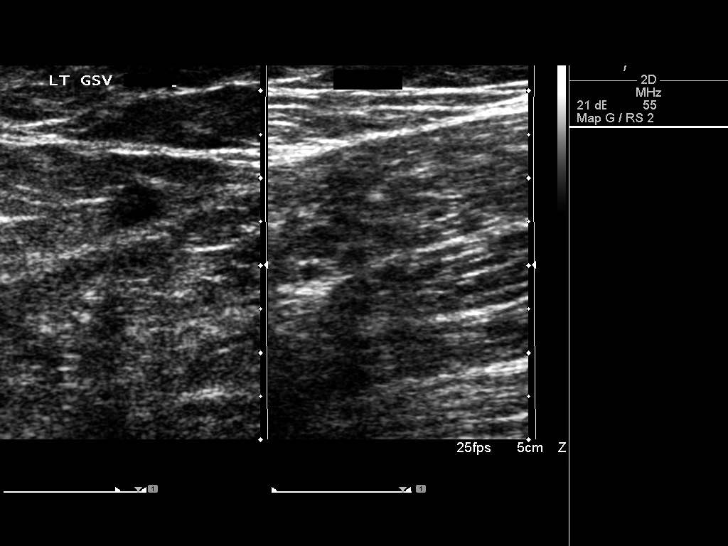
[im 15/32]
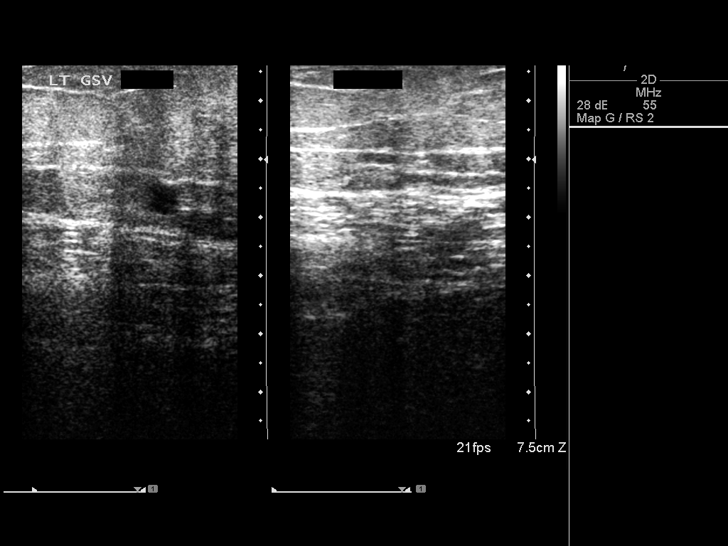
[im 17/32]
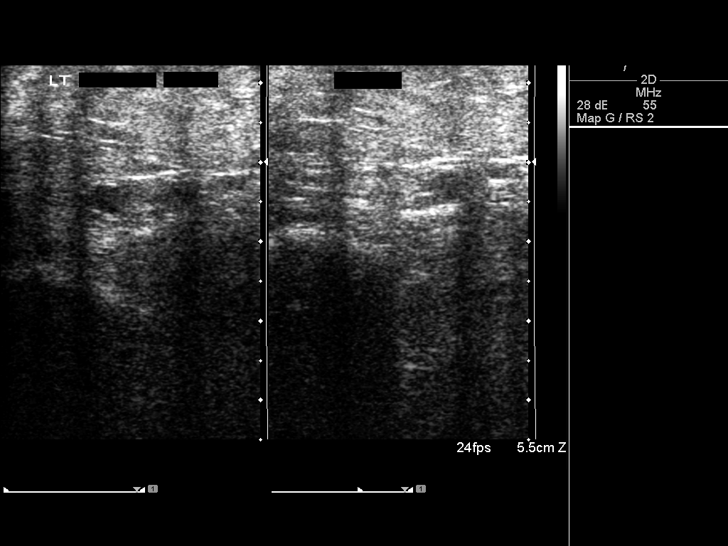
[im 19/32]
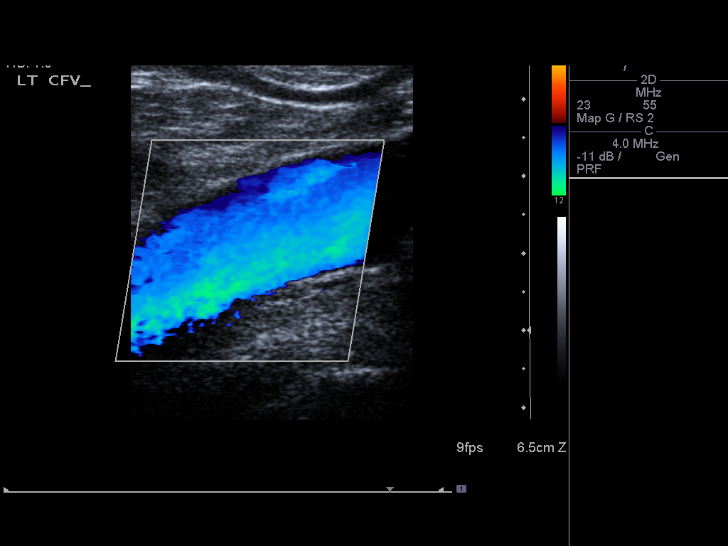
[im 22/32]
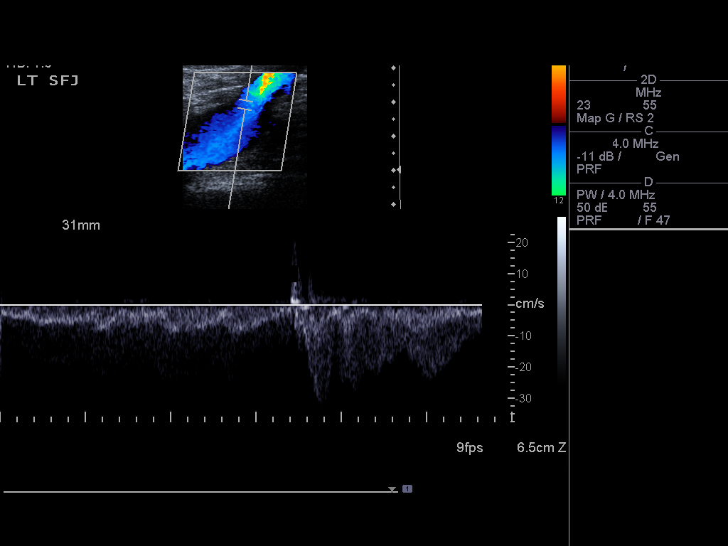
[im 25/32]
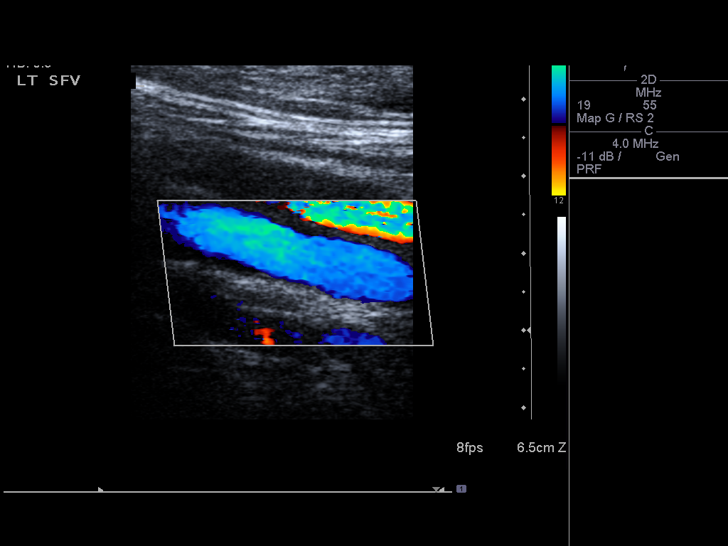
[im 26/32]
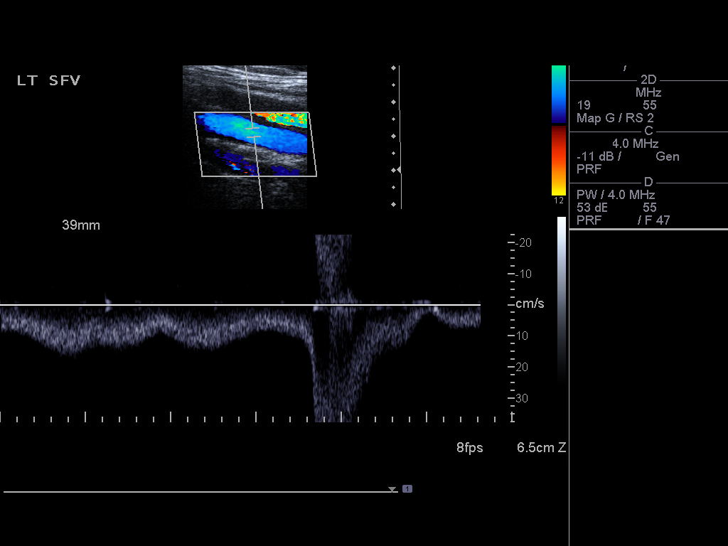
[im 29/32]
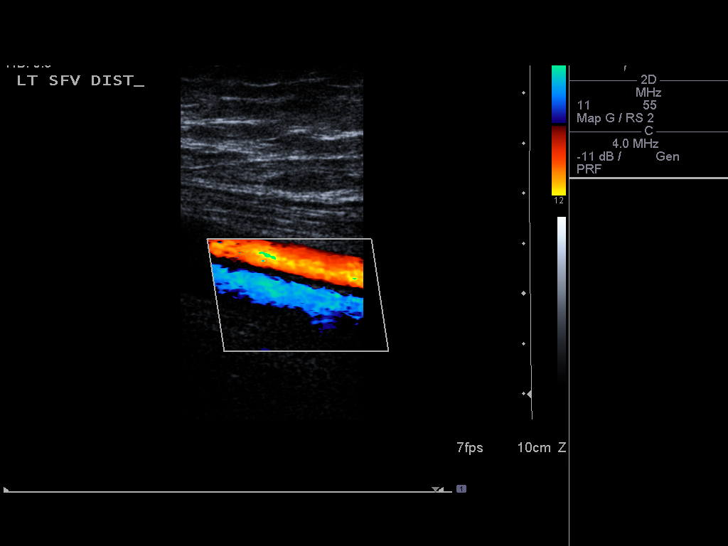
[im 32/32]
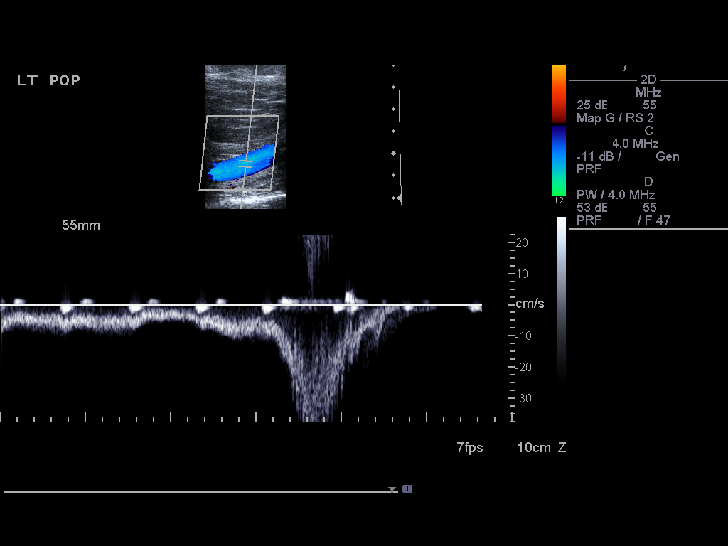

[14 of 24 positions shown; findings below may reference images not displayed]

FINDINGS: There is patent flow in the common femoral, profunda
femoral, superficial femoral and popliteal veins.  These vessels
were completely compressible and demonstrate increased flow with
augmentation.
IMPRESSION: Negative venous Doppler examination for deep venous thrombosis.

## 2014-05-18 ENCOUNTER — Other Ambulatory Visit: Payer: Self-pay

## 2014-05-18 DIAGNOSIS — Z1231 Encounter for screening mammogram for malignant neoplasm of breast: Secondary | ICD-10-CM

## 2014-06-30 ENCOUNTER — Ambulatory Visit: Payer: Federal, State, Local not specified - PPO

## 2014-07-03 ENCOUNTER — Ambulatory Visit: Payer: Federal, State, Local not specified - PPO

## 2014-07-07 ENCOUNTER — Ambulatory Visit
Admission: RE | Admit: 2014-07-07 | Discharge: 2014-07-07 | Disposition: A | Discharge status: Home / Self Care | Payer: Federal, State, Local not specified - PPO | Source: Ambulatory Visit

## 2014-07-07 DIAGNOSIS — Z1231 Encounter for screening mammogram for malignant neoplasm of breast: Secondary | ICD-10-CM

## 2015-08-17 ENCOUNTER — Encounter: Payer: Self-pay | Admitting: *Deleted

## 2015-08-20 ENCOUNTER — Other Ambulatory Visit (INDEPENDENT_AMBULATORY_CARE_PROVIDER_SITE_OTHER): Payer: Federal, State, Local not specified - PPO

## 2015-08-20 ENCOUNTER — Ambulatory Visit (INDEPENDENT_AMBULATORY_CARE_PROVIDER_SITE_OTHER)
Admission: RE | Admit: 2015-08-20 | Discharge: 2015-08-20 | Disposition: A | Discharge status: Home / Self Care | Payer: Federal, State, Local not specified - PPO | Source: Ambulatory Visit | Attending: Pulmonary Disease | Admitting: Pulmonary Disease

## 2015-08-20 ENCOUNTER — Encounter: Payer: Self-pay | Admitting: Pulmonary Disease

## 2015-08-20 ENCOUNTER — Ambulatory Visit (INDEPENDENT_AMBULATORY_CARE_PROVIDER_SITE_OTHER): Payer: Federal, State, Local not specified - PPO | Admitting: Pulmonary Disease

## 2015-08-20 VITALS — BP 108/64 | HR 76 | Ht 63.0 in | Wt 204.2 lb

## 2015-08-20 DIAGNOSIS — R059 Cough, unspecified: Secondary | ICD-10-CM

## 2015-08-20 DIAGNOSIS — J302 Other seasonal allergic rhinitis: Secondary | ICD-10-CM

## 2015-08-20 DIAGNOSIS — R05 Cough: Secondary | ICD-10-CM

## 2015-08-20 DIAGNOSIS — J309 Allergic rhinitis, unspecified: Secondary | ICD-10-CM | POA: Insufficient documentation

## 2015-08-20 LAB — CBC WITH DIFFERENTIAL/PLATELET
BASOS ABS: 0 10*3/uL (ref 0.0–0.1)
Basophils Relative: 0.4 % (ref 0.0–3.0)
EOS ABS: 0.5 10*3/uL (ref 0.0–0.7)
Eosinophils Relative: 4.8 % (ref 0.0–5.0)
HCT: 38 % (ref 36.0–46.0)
HEMOGLOBIN: 12.9 g/dL (ref 12.0–15.0)
Lymphocytes Relative: 40.1 % (ref 12.0–46.0)
Lymphs Abs: 3.8 10*3/uL (ref 0.7–4.0)
MCHC: 33.8 g/dL (ref 30.0–36.0)
MCV: 85.7 fl (ref 78.0–100.0)
MONO ABS: 0.9 10*3/uL (ref 0.1–1.0)
Monocytes Relative: 9.3 % (ref 3.0–12.0)
Neutro Abs: 4.3 10*3/uL (ref 1.4–7.7)
Neutrophils Relative %: 45.4 % (ref 43.0–77.0)
Platelets: 318 10*3/uL (ref 150.0–400.0)
RBC: 4.44 Mil/uL (ref 3.87–5.11)
RDW: 13.7 % (ref 11.5–15.5)
WBC: 9.6 10*3/uL (ref 4.0–10.5)

## 2015-08-20 NOTE — Patient Instructions (Signed)
   Call me if your cough worsen or you develop any new symptoms before your next appointment.  We will review your test results at your next appointment.  You will have a breathing test at your next appointment in 4-6 weeks.  TESTS ORDERED: 1. Full pulmonary function testing at follow-up appointment 2. Chest x-ray PA/LAT today 3. Serum IgE, RAST panel, & CBC with differential today

## 2015-08-20 NOTE — Progress Notes (Signed)
Subjective:    Patient ID: Catherine Parker, female    DOB: 1955/07/30, 60 y.o.   MRN: [id number]  HPI For documentation patient was seen November 2016 after being treated in [place] for for bronchitis". At that time patient reported breathing and feeling better. She did reportedly have wheezing on physical exam and was given Asmanex as well as pro-air inhaler. Patient was treated with azithromycin, prednisone, and a subsequent course of Levaquin. She reports her audible wheezing and symptoms had resolved prior to this recent "bronchitis". Denies any dyspnea. She hasn't been using her inhalers due to "confusion". She reports she had another episode of "bronchitis" about 3 weeks ago and was again treated with Azithromycin and inhalers. She reports her wheezing has resolved. She still has intermittent coughing. She reports she will cough more when laying recumbent. She hasn't been taking her cough medication because it makes her drowsy. Initially her mucus was green but has had none recently after antibiotics. She reports the medicine does help her cough. Denies any nocturnal awakenings with coughing. No sinus congestion, pressure, or drainage. No reflux, dyspepsia, or morning brash water taste. Denies any fever, chills, or sweats recently but did previously have a fever & sweats. Questionable sick contacts. She reports she gets bronchitis at most twice yearly that usually starts with sinus congestion. She has had pneumonia twice - 1998 & 1999. No breathing problems as a child. Does report seasonal sinus congestion & allergies.   Review of Systems No chest pain, pressure, or tightness. No rashes or bruising. A pertinent 14 point review of systems is negative except as per the history of presenting illness.  Allergies  Allergen Reactions  . Codeine Nausea Only  . Latex   . Penicillins   . Shellfish Allergy Hives    Current Outpatient Prescriptions on File Prior to Visit  Medication Sig  Dispense Refill  . fish oil-omega-3 fatty acids 1000 MG capsule Take 2 g by mouth daily.    Marland Kitchen ibuprofen (ADVIL,MOTRIN) 800 MG tablet Take 800 mg by mouth every 8 (eight) hours as needed.    . Multiple Vitamin (MULTIVITAMIN) tablet Take 1 tablet by mouth daily.     No current facility-administered medications on file prior to visit.    Past Medical History  Diagnosis Date  . Pain in limb   . Varicose veins of lower extremities with other complications   . Joint ache   . H/O herpes simplex type 2 infection   . Hx of bilateral breast reduction surgery   . High cholesterol   . Herpes simplex without mention of complication   . Vitamin deficiency   . OSA (obstructive sleep apnea)     Past Surgical History  Procedure Laterality Date  . Carpal tunnel release    . Knee arthroscopy    . Bunionectomy    . Breast surgery      breast reduction-bilat  . Knee arthroscopy with lateral menisectomy  03/12/2012    Procedure: KNEE ARTHROSCOPY WITH LATERAL MENISECTOMY;  Surgeon: Harvie Junior, MD;  Location: [facility];  Service: Orthopedics;  Laterality: Left;  . Knee arthroscopy with medial menisectomy  03/12/2012    Procedure: KNEE ARTHROSCOPY WITH MEDIAL MENISECTOMY;  Surgeon: Harvie Junior, MD;  Location: [facility];  Service: Orthopedics;  Laterality: Left;  . Chondroplasty  03/12/2012    Procedure: CHONDROPLASTY;  Surgeon: Harvie Junior, MD;  Location: [facility];  Service: Orthopedics;  Laterality: Left;  .  Hand surgery Left 02/06/10    Dr Teressa SenterSypher    Family History  Problem Relation Age of Onset  . Diabetes Mother   . Heart disease Mother   . Diabetes Father   . Heart disease Father   . Diabetes Sister   . Diabetes Maternal Aunt   . Heart disease Maternal Uncle   . Cancer Paternal Aunt   . Heart disease Maternal Grandmother   . Heart disease Maternal Grandfather   . Diabetes Sister   . Alcohol abuse Paternal Aunt   . Heart attack Father    . Heart attack Mother   . Asthma Mother   . Leukemia Mother     Social History   Social History  . Marital Status: Single    Spouse Name: N/A  . Number of Children: N/A  . Years of Education: N/A   Social History Main Topics  . Smoking status: Passive Smoke Exposure - Never Smoker  . Smokeless tobacco: Never Used     Comment: Father smoked.   . Alcohol Use: 0.0 oz/week    0 Standard drinks or equivalent per week     Comment: socially -- wine  . Drug Use: No  . Sexual Activity:    Partners: Male    Birth Control/ Protection: IUD     Comment: MIRENA   Other Topics Concern  . 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 Moved to [place] in 1993. Previously has traveled to [place] and also on cruises. 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 No pets currently. 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 Does have carpet in her bedrooms. No hot tub exposure.       Objective:   Physical Exam BP 108/64 mmHg  Pulse 76  Ht 5\' 3"  (1.6 m)  Wt 204 lb 3.2 oz (92.625 kg)  BMI 36.18 kg/m2  SpO2 97% General:  Awake. Alert. No acute distress. Central obesity.  Integument:  Warm & dry. No rash on exposed skin. No bruising. Lymphatics:  No appreciated cervical or supraclavicular lymphadenoapthy. HEENT:  Moist mucus membranes. No oral ulcers. No scleral injection or icterus. Moderate bilateral nasal turbinate swelling. Cardiovascular:  Regular rate. No edema. No appreciable JVD.  Pulmonary:  Good aeration & clear to auscultation bilaterally. Symmetric chest wall expansion. No accessory muscle use. Abdomen: Soft. Normal bowel sounds. Protuberant. Musculoskeletal:  Normal bulk and tone. No joint deformity or effusion appreciated. Neurological:  CN 2-12 grossly in tact. No meningismus. Moving all 4 extremities equally.  Psychiatric:  Mood and affect congruent. Speech normal rhythm, rate & tone.       Assessment & Plan:  60 year oldWith recent acute bronchitis and infectious type symptoms. I suspect the patient's persistent cough is likely a postinfectious cough. Certainly with her history of seasonal allergies and the seasonal nature of this "bronchitis" I do question whether or not she may have underlying asthma. I instructed the patient to contact my office if she had any worsening in her cough or new breathing problems before her next appointment.  1. Cough: Checking chest x-ray PA/LAT. Checking full pulmonary function testing at follow-up appointment. 2. Seasonal Allergic Rhinitis: Checking serum IgE, RAST panel, & CBC with differential. 3. Follow-up: Return to clinic in 4-6 weeks or sooner if needed.  Donna ChristenJennings E. Jamison NeighborNestor, M.D. [facility] Pager:  [phone]  After 3pm or if no response, call [phone] 10:17 AM 08/20/2015

## 2015-08-20 NOTE — Addendum Note (Signed)
Addended by: Boone MasterJONES, JESSICA E on: 08/20/2015 10:21 AM   Modules accepted: Orders

## 2015-08-21 LAB — RESPIRATORY ALLERGY PROFILE REGION II ~~LOC~~
Allergen, Cedar tree, t12: <0.1 kU/L
Allergen, D pternoyssinus,d7: <0.1 kU/L
Allergen, Mouse Urine Protein, e78: <0.1 kU/L
Allergen, Oak,t7: <0.1 kU/L
Box Elder IgE: 0.13 kU/L — ABNORMAL HIGH
Cat Dander: <0.1 kU/L
D. farinae: <0.1 kU/L
Dog Dander: <0.1 kU/L
Elm IgE: <0.1 kU/L
IGE (IMMUNOGLOBULIN E), SERUM: 65 kU/L (ref <115)
Johnson Grass: <0.1 kU/L
Rough Pigweed  IgE: <0.1 kU/L
Sheep Sorrel IgE: <0.1 kU/L

## 2015-08-27 ENCOUNTER — Other Ambulatory Visit: Payer: Self-pay | Admitting: Pulmonary Disease

## 2015-08-27 DIAGNOSIS — R05 Cough: Secondary | ICD-10-CM

## 2015-08-27 DIAGNOSIS — R059 Cough, unspecified: Secondary | ICD-10-CM

## 2015-09-05 ENCOUNTER — Ambulatory Visit (INDEPENDENT_AMBULATORY_CARE_PROVIDER_SITE_OTHER)
Admission: RE | Admit: 2015-09-05 | Discharge: 2015-09-05 | Disposition: A | Discharge status: Home / Self Care | Payer: Federal, State, Local not specified - PPO | Source: Ambulatory Visit | Attending: Pulmonary Disease | Admitting: Pulmonary Disease

## 2015-09-05 DIAGNOSIS — R05 Cough: Secondary | ICD-10-CM | POA: Diagnosis not present

## 2015-09-05 DIAGNOSIS — R918 Other nonspecific abnormal finding of lung field: Secondary | ICD-10-CM | POA: Insufficient documentation

## 2015-09-05 DIAGNOSIS — R059 Cough, unspecified: Secondary | ICD-10-CM

## 2015-09-06 ENCOUNTER — Ambulatory Visit (INDEPENDENT_AMBULATORY_CARE_PROVIDER_SITE_OTHER): Payer: Federal, State, Local not specified - PPO | Admitting: Pulmonary Disease

## 2015-09-06 DIAGNOSIS — R05 Cough: Secondary | ICD-10-CM | POA: Diagnosis not present

## 2015-09-06 DIAGNOSIS — R059 Cough, unspecified: Secondary | ICD-10-CM

## 2015-09-06 LAB — PULMONARY FUNCTION TEST
DL/VA % pred: 107 %
DL/VA: 5.02 ml/min/mmHg/L
DLCO UNC % PRED: 62 %
DLCO UNC: 14.25 ml/min/mmHg
DLCO cor % pred: 61 %
DLCO cor: 14.16 ml/min/mmHg
FEF 25-75 POST: 2.85 L/s
FEF 25-75 PRE: 1.93 L/s
FEF2575-%Change-Post: 47 %
FEF2575-%PRED-PRE: 94 %
FEF2575-%Pred-Post: 139 %
FEV1-%Change-Post: 5 %
FEV1-%PRED-POST: 75 %
FEV1-%Pred-Pre: 71 %
FEV1-POST: 1.53 L
FEV1-PRE: 1.45 L
FEV1FVC-%CHANGE-POST: 3 %
FEV1FVC-%PRED-PRE: 108 %
FEV6-%CHANGE-POST: 3 %
FEV6-%PRED-POST: 68 %
FEV6-%Pred-Pre: 66 %
FEV6-Post: 1.71 L
FEV6-Pre: 1.65 L
FEV6FVC-%CHANGE-POST: 0 %
FEV6FVC-%Pred-Post: 104 %
FEV6FVC-%Pred-Pre: 103 %
FVC-%CHANGE-POST: 2 %
FVC-%Pred-Post: 66 %
FVC-%Pred-Pre: 64 %
FVC-Post: 1.71 L
FVC-Pre: 1.67 L
POST FEV1/FVC RATIO: 90 %
Post FEV6/FVC ratio: 100 %
Pre FEV1/FVC ratio: 87 %
Pre FEV6/FVC Ratio: 99 %
RV % PRED: 65 %
RV: 1.26 L
TLC % PRED: 60 %
TLC: 2.96 L

## 2015-09-06 NOTE — Progress Notes (Signed)
PFT done today. 

## 2015-09-10 ENCOUNTER — Encounter: Payer: Self-pay | Admitting: *Deleted

## 2015-09-25 ENCOUNTER — Other Ambulatory Visit: Payer: Federal, State, Local not specified - PPO

## 2015-09-25 ENCOUNTER — Telehealth: Payer: Self-pay | Admitting: Pulmonary Disease

## 2015-09-25 ENCOUNTER — Encounter: Payer: Self-pay | Admitting: Pulmonary Disease

## 2015-09-25 ENCOUNTER — Ambulatory Visit (INDEPENDENT_AMBULATORY_CARE_PROVIDER_SITE_OTHER): Payer: Federal, State, Local not specified - PPO | Admitting: Pulmonary Disease

## 2015-09-25 VITALS — BP 122/70 | HR 72 | Ht 63.0 in | Wt 210.6 lb

## 2015-09-25 DIAGNOSIS — J302 Other seasonal allergic rhinitis: Secondary | ICD-10-CM

## 2015-09-25 DIAGNOSIS — J984 Other disorders of lung: Secondary | ICD-10-CM

## 2015-09-25 DIAGNOSIS — R918 Other nonspecific abnormal finding of lung field: Secondary | ICD-10-CM

## 2015-09-25 DIAGNOSIS — R05 Cough: Secondary | ICD-10-CM

## 2015-09-25 DIAGNOSIS — R059 Cough, unspecified: Secondary | ICD-10-CM

## 2015-09-25 DIAGNOSIS — R600 Localized edema: Secondary | ICD-10-CM

## 2015-09-25 LAB — RHEUMATOID FACTOR: Rhuematoid fact SerPl-aCnc: <10 IU/mL (ref <=14)

## 2015-09-25 NOTE — Patient Instructions (Addendum)
   Call me if your cough worsens or you notice any new breathing problems.  We may do a special x-ray of your chest to look for blood clots depending on the findings of your heart ultrasound.  You will have a breathing & walking test at your next appointment in 1-2 months.  TESTS ORDERED: 1. Serum SSA, SSB, ANA, Rheumatoid Factor, & Anti-CCP. 2. Spirometry with DLCO at follow-up 3. on room air at follow-up 4. Complete Echocardiogram w/ Contrast

## 2015-09-25 NOTE — Progress Notes (Signed)
Subjective:    Patient ID: Catherine Parker, female    DOB: [birth year], 60 y.o.   MRN: [id number]  C.C.:  Follow-up for Cough, Moderate Restrictive Lung Disease, Right Lung Nodules, & Seasonal Allergic Rhinitis.  HPI Cough:  She reports she has noticed coughing for with exposure to cold air. She reports her cough seems to be improving. She denies any dyspnea.   Moderate Restrictive Lung Disease:Likely secondary to obesity. No evidence for interstitial lung disease on CT imaging.  Right Lung Nodules: 2 mm nodules seen on high-resolution CT scan July 2017.  Seasonal Allergic Rhinitis:  She denies any sinus congestion, pressure, or drainage.   Review of Systems Denies any dry eyes, dry mouth, or oral ulcers. She does have joint pain in her knees. Denies any joint swelling or erythema. No joint stiffness in her hands. No dysphagia or odynophagia. Reports she is having intermittent edema but denies any chest pain or pressure.  Allergies  Allergen Reactions  . Codeine Nausea Only  . Latex   . Penicillins   . Shellfish Allergy Hives    Current Outpatient Prescriptions on File Prior to Visit  Medication Sig Dispense Refill  . Albuterol Sulfate (PROAIR RESPICLICK) 108 (90 Base) MCG/ACT AEPB Inhale 2 puffs into the lungs every 6 (six) hours as needed. Reported on 08/20/2015    . Vitamin D, Ergocalciferol, (DRISDOL) 50000 units CAPS capsule Take 1 capsule by mouth every Thursday.    . zolpidem (AMBIEN) 10 MG tablet 1/2 tab by mouth at bedtime if needed     No current facility-administered medications on file prior to visit.     Past Medical History:  Diagnosis Date  . H/O herpes simplex type 2 infection   . Herpes simplex without mention of complication   . High cholesterol   . Hx of bilateral breast reduction surgery   . Joint ache   . OSA (obstructive sleep apnea)   . Pain in limb   . Varicose veins of lower extremities with other complications   . Vitamin deficiency      Past Surgical History:  Procedure Laterality Date  . BREAST SURGERY     breast reduction-bilat  . BUNIONECTOMY    . CARPAL TUNNEL RELEASE    . CHONDROPLASTY  03/12/2012   Procedure: CHONDROPLASTY;  Surgeon: Harvie Junior, MD;  Location: [facility];  Service: Orthopedics;  Laterality: Left;  . HAND SURGERY Left 02/06/10   Dr Teressa Senter  . KNEE ARTHROSCOPY    . KNEE ARTHROSCOPY WITH LATERAL MENISECTOMY  03/12/2012   Procedure: KNEE ARTHROSCOPY WITH LATERAL MENISECTOMY;  Surgeon: Harvie Junior, MD;  Location: [facility];  Service: Orthopedics;  Laterality: Left;  . KNEE ARTHROSCOPY WITH MEDIAL MENISECTOMY  03/12/2012   Procedure: KNEE ARTHROSCOPY WITH MEDIAL MENISECTOMY;  Surgeon: Harvie Junior, MD;  Location: [facility];  Service: Orthopedics;  Laterality: Left;    Family History  Problem Relation Age of Onset  . Diabetes Mother   . Heart disease Mother   . Diabetes Father   . Heart disease Father   . Diabetes Sister   . Diabetes Maternal Aunt   . Heart disease Maternal Uncle   . Cancer Paternal Aunt   . Heart disease Maternal Grandmother   . Heart disease Maternal Grandfather   . Diabetes Sister   . Alcohol abuse Paternal Aunt   . Heart attack Father   . Heart attack Mother   . Asthma Mother   .  Leukemia Mother     Social History   Social History  . Marital status: Single    Spouse name: N/A  . Number of children: N/A  . Years of education: N/A   Social History Main Topics  . Smoking status: Passive Smoke Exposure - Never Smoker  . Smokeless tobacco: Never Used     Comment: Father smoked.   . Alcohol use 0.0 oz/week     Comment: socially -- wine  . Drug use: No  . Sexual activity: Yes    Partners: Male    Birth control/ protection: IUD     Comment: MIRENA   Other Topics Concern  . None   Social History Narrative   Single   Research officer, political party   No recent travel      Adult nurse Pulmonary:      Originally from [place].  Moved to [place] in 1993. Previously has traveled to [place] and also on cruises. 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 Previously owned a Adult nurse around 62 for 4 months. Does have carpet in her bedrooms. No hot tub exposure.       Objective:   Physical Exam BP 122/70 (BP Location: Left Arm, Cuff Size: Large)   Pulse 72   Ht 5\' 3"  (1.6 m)   Wt 210 lb 9.6 oz (95.5 kg)   SpO2 99%   BMI 37.31 kg/m  General:  Awake. Alert. No distress. Central obesity.  Integument:  Warm & dry. No rash on exposed skin.  Lymphatics:  No appreciated cervical or supraclavicular lymphadenoapthy. HEENT:  Moist mucus membranes. No oral ulcers. Mild erythema of left external auditory meatus. No scleral injection. Cardiovascular:  Regular rate. No edema. Normal S1 & S2.  Pulmonary:  Clear bilaterally to auscultation. Normal work of breathing on room air. Speaking in complete sentences. Musculoskeletal: Normal bulk and tone. No joint deformity or effusion appreciated.  PFT 09/06/15: FVC 1.67 L (64%) FEV1 1.45 L (71%) FEV1/FVC 0.87 FEF 25-75 1.93 L (94%) negative bronchodilator response TLC 2.96 L (60%) RV 65% ERV 86% DLCO corrected 61% (hemoglobin 13.6)  IMAGING CT CHEST W/O 09/05/15 (personally reviewed by me): 2 mm subpleural pulmonary nodules noted within the right lung. No developing parenchymal mass. No pleural effusion or thickening. No pericardial effusion. No pathologic mediastinal adenopathy.  CXR PA/LAT 08/20/15 (personally reviewed by me): Low lung volumes. Radiologist commented on an interstitial prominence as well as cardiomegaly. However, I do not appreciate cardiomegaly and suspect interstitial prominence is due to poor inspiratory effort. No focal opacity or pleural effusion appreciated. Mediastinum normal in contour.  LABS 08/20/15 CBC: 9.6/12.9/3.0/318 Eosinophils: 0.5. IgE: 65 RAST Panel: [place] 0.13    Assessment & Plan:  60 year old with recent acute bronchitis.Patient does have  moderate restrictive lung disease with a decreased carbon dioxide diffusion capacity. There is no findings that would be suggestive of interstitial lung disease on her CT scan. However, she does have a subcentimeter lung nodules that could represent an underlying inflammatory process such as Sjogren's or rheumatoid arthritis. Her cough seems to be improving somewhat and I suspect she could potentially have mild underlying asthma. We did discuss the low risk of malignancy with her 2 mm nodules and she is contemplating on further chest CT imaging. I instructed the patient contact my office if she had any new breathing problems or questions before next appointment.   1. Cough:  Improving. Possibly secondary to mild intermittent asthma. 2. Moderate Restrictive Lung Disease: Likely secondary to obesity.  Checking spirometry with DLCO next appointment. Also checking 6 minute walk test on room air at next appointment. Checking complete transthoracic echocardiogram. 3. Right Lung Nodules: Patient contemplating repeat CT imaging in 1 year. Checking serum ANA, SSA, SSB, rheumatoid factor, & anti-CCP. 4. Seasonal Allergic Rhinitis: Mild & asymptomatic. No new medications. 5. Follow-up: Return to clinic in 1-2 months or sooner if needed.  Donna Christen Jamison Neighbor, M.D. [facility] Pager:  [phone] After 3pm or if no response, call [phone] 3:02 PM 09/25/15

## 2015-09-25 NOTE — Telephone Encounter (Signed)
PFT 09/06/15: FVC 1.67 L (64%) FEV1 1.45 L (71%) FEV1/FVC 0.87 FEF 25-75 1.93 L (94%) negative bronchodilator response TLC 2.96 L (60%) RV 65% ERV 86% DLCO corrected 61% (hemoglobin 13.6)  IMAGING CT CHEST W/O 09/05/15 (personally reviewed by me): 2 mm subpleural pulmonary nodules noted within the right lung. No developing parenchymal mass. No pleural effusion or thickening. No pericardial effusion. No pathologic mediastinal adenopathy.  CXR PA/LAT 08/20/15 (personally reviewed by me): Low lung volumes. Radiologist commented on an interstitial prominence as well as cardiomegaly. However, I do not appreciate cardiomegaly and suspect interstitial prominence is due to poor inspiratory effort. No focal opacity or pleural effusion appreciated. Mediastinum normal in contour.  LABS 08/20/15 CBC: 9.6/12.9/3.0/318 Eosinophils: 0.5. IgE: 65 RAST Panel: [place] 0.13

## 2015-09-26 LAB — CYCLIC CITRUL PEPTIDE ANTIBODY, IGG: Cyclic Citrullin Peptide Ab: <16 Units

## 2015-09-26 LAB — SJOGRENS SYNDROME-A EXTRACTABLE NUCLEAR ANTIBODY: SSA (RO) (ENA) ANTIBODY, IGG: NEGATIVE

## 2015-09-26 LAB — SJOGRENS SYNDROME-B EXTRACTABLE NUCLEAR ANTIBODY: SSB (La) (ENA) Antibody, IgG: <1

## 2015-09-26 LAB — ANA: ANA: NEGATIVE

## 2015-10-02 ENCOUNTER — Telehealth: Payer: Self-pay | Admitting: Pulmonary Disease

## 2015-10-02 NOTE — Telephone Encounter (Signed)
Pt calling for results of testing.Catherine Parker

## 2015-10-02 NOTE — Telephone Encounter (Signed)
Catherine Amble, MD  Tommie Sams, CMA        Please call the patient and let her know that her blood work was normal. Thank you.   I spoke with patient about results and she verbalized understanding and had no questions

## 2015-10-04 ENCOUNTER — Ambulatory Visit (HOSPITAL_COMMUNITY): Payer: Federal, State, Local not specified - PPO | Attending: Cardiology

## 2015-10-04 ENCOUNTER — Encounter (INDEPENDENT_AMBULATORY_CARE_PROVIDER_SITE_OTHER): Payer: Self-pay

## 2015-10-04 ENCOUNTER — Other Ambulatory Visit: Payer: Self-pay

## 2015-10-04 DIAGNOSIS — R600 Localized edema: Secondary | ICD-10-CM

## 2015-10-04 DIAGNOSIS — G4733 Obstructive sleep apnea (adult) (pediatric): Secondary | ICD-10-CM | POA: Insufficient documentation

## 2015-10-12 NOTE — Progress Notes (Signed)
lmtcb for pt.  

## 2015-10-15 NOTE — Progress Notes (Signed)
Called and spoke to pt. Informed her of the results per JN. Pt verbalized understanding and denied any further questions or concerns at this time.   

## 2015-10-30 ENCOUNTER — Ambulatory Visit (INDEPENDENT_AMBULATORY_CARE_PROVIDER_SITE_OTHER): Payer: Federal, State, Local not specified - PPO | Admitting: Pulmonary Disease

## 2015-10-30 ENCOUNTER — Encounter (INDEPENDENT_AMBULATORY_CARE_PROVIDER_SITE_OTHER): Payer: Federal, State, Local not specified - PPO | Admitting: Pulmonary Disease

## 2015-10-30 DIAGNOSIS — R0600 Dyspnea, unspecified: Secondary | ICD-10-CM

## 2015-10-30 DIAGNOSIS — J984 Other disorders of lung: Secondary | ICD-10-CM

## 2015-10-31 NOTE — Progress Notes (Signed)
6MWT 10/30/15:  Walked 489 meters / Baseline Sat 99% on RA / Nadir sat 94% on RA

## 2015-11-07 ENCOUNTER — Encounter: Payer: Self-pay | Admitting: Pulmonary Disease

## 2015-11-07 ENCOUNTER — Ambulatory Visit (INDEPENDENT_AMBULATORY_CARE_PROVIDER_SITE_OTHER): Payer: Federal, State, Local not specified - PPO | Admitting: Pulmonary Disease

## 2015-11-07 VITALS — BP 118/74 | HR 82 | Ht 63.0 in | Wt 209.0 lb

## 2015-11-07 DIAGNOSIS — J302 Other seasonal allergic rhinitis: Secondary | ICD-10-CM | POA: Diagnosis not present

## 2015-11-07 DIAGNOSIS — Z23 Encounter for immunization: Secondary | ICD-10-CM

## 2015-11-07 DIAGNOSIS — R918 Other nonspecific abnormal finding of lung field: Secondary | ICD-10-CM

## 2015-11-07 DIAGNOSIS — J984 Other disorders of lung: Secondary | ICD-10-CM

## 2015-11-07 NOTE — Addendum Note (Signed)
Addended by: Garfield CorneaMABRY, Zan Triska L on: 11/07/2015 10:34 AM   Modules accepted: Orders

## 2015-11-07 NOTE — Progress Notes (Signed)
Subjective:    Patient ID: Catherine Parker, female    DOB: March 24, 1955, 60 y.o.   MRN: [id number]  C.C.:  Follow-up for Cough, Moderate Restrictive Lung Disease, Right Lung Nodules, & Seasonal Allergic Rhinitis.  HPI Cough:  She reports her cough has completely resolved. She denies any dyspnea.     Moderate Restrictive Lung Disease:Likely secondary to obesity. No evidence for interstitial lung disease on CT imaging and no cardiomyopathy on echocardiogram. No evidence for oxygen requirement with 6 minute walk test.  Right Lung Nodules: 2 mm nodules seen on high-resolution CT scan July 2017. Autoimmune workup negative.  Seasonal Allergic Rhinitis:  She denies any recent sinus congestion, pressure, or drainage. She reports she has had some ear fullness recently that resolved with starting a nasal saline rinse.   Review of Systems No fever, chills, or sweats. No chest pain or pressure. No nausea or emesis.   Allergies  Allergen Reactions  . Codeine Nausea Only  . Latex   . Penicillins   . Shellfish Allergy Hives    Current Outpatient Prescriptions on File Prior to Visit  Medication Sig Dispense Refill  . zolpidem (AMBIEN) 10 MG tablet 1/2 tab by mouth at bedtime if needed    . Albuterol Sulfate (PROAIR RESPICLICK) 108 (90 Base) MCG/ACT AEPB Inhale 2 puffs into the lungs every 6 (six) hours as needed. Reported on 08/20/2015    . Vitamin D, Ergocalciferol, (DRISDOL) 50000 units CAPS capsule Take 1 capsule by mouth every Thursday.     No current facility-administered medications on file prior to visit.     Past Medical History:  Diagnosis Date  . H/O herpes simplex type 2 infection   . Herpes simplex without mention of complication   . High cholesterol   . Hx of bilateral breast reduction surgery   . Joint ache   . OSA (obstructive sleep apnea)   . Pain in limb   . Varicose veins of lower extremities with other complications   . Vitamin deficiency     Past Surgical  History:  Procedure Laterality Date  . BREAST SURGERY     breast reduction-bilat  . BUNIONECTOMY    . CARPAL TUNNEL RELEASE    . CHONDROPLASTY  03/12/2012   Procedure: CHONDROPLASTY;  Surgeon: Harvie JuniorJohn L Graves, MD;  Location: [facility];  Service: Orthopedics;  Laterality: Left;  . HAND SURGERY Left 02/06/10   Dr Teressa SenterSypher  . KNEE ARTHROSCOPY    . KNEE ARTHROSCOPY WITH LATERAL MENISECTOMY  03/12/2012   Procedure: KNEE ARTHROSCOPY WITH LATERAL MENISECTOMY;  Surgeon: Harvie JuniorJohn L Graves, MD;  Location: [facility];  Service: Orthopedics;  Laterality: Left;  . KNEE ARTHROSCOPY WITH MEDIAL MENISECTOMY  03/12/2012   Procedure: KNEE ARTHROSCOPY WITH MEDIAL MENISECTOMY;  Surgeon: Harvie JuniorJohn L Graves, MD;  Location: [facility];  Service: Orthopedics;  Laterality: Left;    Family History  Problem Relation Age of Onset  . Diabetes Mother   . Heart disease Mother   . Diabetes Father   . Heart disease Father   . Diabetes Sister   . Diabetes Maternal Aunt   . Heart disease Maternal Uncle   . Cancer Paternal Aunt   . Heart disease Maternal Grandmother   . Heart disease Maternal Grandfather   . Diabetes Sister   . Alcohol abuse Paternal Aunt   . Heart attack Father   . Heart attack Mother   . Asthma Mother   . Leukemia Mother  Social History   Social History  . Marital status: Single    Spouse name: N/A  . Number of children: N/A  . Years of education: N/A   Social History Main Topics  . Smoking status: Passive Smoke Exposure - Never Smoker  . Smokeless tobacco: Never Used     Comment: Father smoked.   . Alcohol use 0.0 oz/week     Comment: socially -- wine  . Drug use: No  . Sexual activity: Yes    Partners: Male    Birth control/ protection: IUD     Comment: MIRENA   Other Topics Concern  . None   Social History Narrative   Single   [employer] Supervisor   No recent travel      [facility]:      Originally from [place]. Moved to [place] in 1993.  Previously has traveled to [place] and also on cruises. Works as a [employer] supervisor. No pets currently. Previously owned a parrot around 1989 for 4 months. Does have carpet in her bedrooms. No hot tub exposure.       Objective:   Physical Exam BP 118/74 (BP Location: Right Arm, Patient Position: Sitting, Cuff Size: Normal)   Pulse 82   Ht 5\' 3"  (1.6 m)   Wt 209 lb (94.8 kg)   SpO2 97%   BMI 37.02 kg/m  General:  Awake. No distress. Central obesity.  Integument:  Warm & dry. No rash on exposed skin.  Lymphatics:  No appreciated cervical or supraclavicular lymphadenoapthy. HEENT:  Moist mucus membranes. No oral ulcers. Minimal nasal turbinate swelling. Cardiovascular:  Regular rate & rhythm. No edema. Normal S1 & S2.  Pulmonary:  Normal work of breathing on room air. Clear bilaterally to auscultation. Speaking in complete sentences. Musculoskeletal: Normal bulk and tone. No joint deformity or effusion appreciated.  PFT 10/30/15: FVC 1.63 L (63%) FEV1 1.43 L (70%) FEV1/FVC 2.16 L (106%)                                                                                                                                            DLCO corrected 67% (hgb 13.5) 09/06/15: FVC 1.67 L (64%) FEV1 1.45 L (71%) FEV1/FVC 0.87 FEF 25-75 1.93 L (94%) negative bronchodilator response TLC 2.96 L (60%) RV 65% ERV 86% DLCO corrected 61% (hemoglobin 13.6)  <MEASUREMENTGeaKentuckyniMari tGeaKentuckyniWashing oGeaKentuckyniForbes Ambulatory Surgery eGeaKentuckyniW o eGeaKentuckyniCookeville Regional MGeaKent cGeaKentuckyniMuenst rGeaKentuckyniGood Samarit nGeaKentuckyniThe Cataract Surge yGeaKentuckyniTrinity Surgery Center LLC Dba Ba cGeaKentuckyniAdvanced Medical Imagi gGeaKentuckyniCenter For Adv nGeaKentuckyniWishek Com uGeaKentuckyni oGeaKentuckyniLackawanna Physicians Ambulatory Surgery Center LLC Dba North EGeaKentuckyniPerry Comm nGeaKentuckyniHillsboro Area HGeaKentuckyniSouth Coast Glob lGeaKentuckyniNorthwest Florida Surgical Center Inc Dba North Florida Sur eGeaKentuckyniWhittier Reh bGeaKentuckyniAdvanced Surgery Center Of Northern Louisiana LBotswanaL606Fayrene FearinBarbara CowerWoodlandAir MarylaKearney HarPl O 09/05/15 (previously reviewed by me): 2 mm subpleural pulmonary nodules noted within the right lung. No developing parenchymal mass. No pleural effusion or thickening. No pericardial effusion. No pathologic mediastinal adenopathy.  CXR PA/LAT 08/20/15 (previously reviewed by me): Low lung volumes. Radiologist commented on an interstitial prominence as well as cardiomegaly. However, I do not appreciate cardiomegaly and suspect interstitial prominence is due to  poor inspiratory effort. No focal  opacity or pleural effusion appreciated. Mediastinum normal in contour.  CARDIAC TTE (10/04/15): LV normal in size with EF 55-60% grade 1 diastolic dysfunction. LA & RA normal in size. RV normal in size and function. Pulmonary artery systolic pressure 20 mmHg. No aortic stenosis or regurgitation. Aortic root normal in size. No mitral stenosis or regurgitation. No pulmonic regurgitation. No pericardial effusion.  LABS 09/25/15 ANA: Negative Anti-CCP:  <16 RF:  <10 SSA:  <1.0 SSB:  <1.0  08/20/15 CBC: 9.6/12.9/3.0/318 Eosinophils: 0.5. IgE: 65 RAST Panel: [place] 0.13    Assessment & Plan:  60 y.o. with previous cough that has completely resolved. I remain suspicious about possible underlying asthma. Reviewing her spirometry and carbon dioxide diffusion capacity there is no evidence of significant decline its previous testing. Additionally, she has no desaturation requiring oxygen therapy on her 6 minute walk test. I also reviewed her serum autoimmune workup which was also negative. I do feel that her seasonal allergies are significant. Factor to her symptoms and instructed her to contact my office if she develops any new cough or worsening in her breathing before her next appointment. I encouraged her to continue to use her nasal saline rinse daily. With the patient's lung nodules on CT imaging she would like to repeat a CT scan in one year which I feel is reasonable according to current guidelines/recommendations.  1. Right Lung Nodules: Repeat CT chest without contrast July 2018. 2. Seasonal Allergic Rhinitis: Recommended continuing nasal saline rinse daily. 3. Moderate Restrictive Lung Disease: Likely secondary to obesity. no further testing necessary.  4. Health Maintenance: Administering Pneumovax 23 today. Recommended influenza vaccine at the end of the month. Plan for Prevnar vaccine at next appointment. 5. Follow-up: Return to clinic in July 2018  after CT scan.   Donna Christen Jamison Neighbor, M.D. [facility] Pulmonary & Critical Care Pager:  [phone] After 3pm or if no response, call [phone] 10:17 AM 11/07/15

## 2015-11-07 NOTE — Patient Instructions (Signed)
   Continue using your nasal saline rinse at least once daily.  Contact me if you have any new breathing problems or coughing before your next appointment.  Remember to get your Flu Shot at the end of the month.   I will see you back in July 2018 after your CT scan.  TESTS ORDERED: 1. CT Chest w/o contrast July 2018

## 2015-11-16 LAB — PULMONARY FUNCTION TEST
DL/VA % PRED: 107 %
DL/VA: 5.02 ml/min/mmHg/L
DLCO COR % PRED: 61 %
DLCO COR: 14.16 ml/min/mmHg
DLCO UNC % PRED: 62 %
DLCO UNC: 14.25 ml/min/mmHg
FEF 25-75 PRE: 1.93 L/s
FEF 25-75 Post: 2.85 L/sec
FEF2575-%Change-Post: 47 %
FEF2575-%PRED-POST: 139 %
FEF2575-%Pred-Pre: 94 %
FEV1-%CHANGE-POST: 5 %
FEV1-%PRED-PRE: 71 %
FEV1-%Pred-Post: 75 %
FEV1-POST: 1.53 L
FEV1-PRE: 1.45 L
FEV1FVC-%CHANGE-POST: 3 %
FEV1FVC-%Pred-Pre: 108 %
FEV6-%Change-Post: 3 %
FEV6-%PRED-PRE: 66 %
FEV6-%Pred-Post: 68 %
FEV6-POST: 1.71 L
FEV6-PRE: 1.65 L
FEV6FVC-%Change-Post: 0 %
FEV6FVC-%PRED-PRE: 103 %
FEV6FVC-%Pred-Post: 104 %
FVC-%CHANGE-POST: 2 %
FVC-%PRED-POST: 66 %
FVC-%Pred-Pre: 64 %
FVC-POST: 1.71 L
FVC-PRE: 1.67 L
POST FEV1/FVC RATIO: 90 %
POST FEV6/FVC RATIO: 100 %
Pre FEV1/FVC ratio: 87 %
Pre FEV6/FVC Ratio: 99 %
RV % pred: 65 %
RV: 1.26 L
TLC % pred: 60 %
TLC: 2.96 L

## 2016-02-11 ENCOUNTER — Ambulatory Visit: Payer: Federal, State, Local not specified - PPO | Admitting: Dietician

## 2016-03-04 ENCOUNTER — Ambulatory Visit (INDEPENDENT_AMBULATORY_CARE_PROVIDER_SITE_OTHER): Payer: Federal, State, Local not specified - PPO | Admitting: Internal Medicine

## 2016-03-04 ENCOUNTER — Ambulatory Visit (INDEPENDENT_AMBULATORY_CARE_PROVIDER_SITE_OTHER)
Admission: RE | Admit: 2016-03-04 | Discharge: 2016-03-04 | Disposition: A | Discharge status: Home / Self Care | Payer: Federal, State, Local not specified - PPO | Source: Ambulatory Visit | Attending: Internal Medicine | Admitting: Internal Medicine

## 2016-03-04 ENCOUNTER — Encounter: Payer: Self-pay | Admitting: Internal Medicine

## 2016-03-04 VITALS — BP 102/78 | HR 85 | Ht 63.0 in | Wt 215.8 lb

## 2016-03-04 DIAGNOSIS — R042 Hemoptysis: Secondary | ICD-10-CM | POA: Diagnosis not present

## 2016-03-04 DIAGNOSIS — J209 Acute bronchitis, unspecified: Secondary | ICD-10-CM

## 2016-03-04 DIAGNOSIS — R918 Other nonspecific abnormal finding of lung field: Secondary | ICD-10-CM | POA: Diagnosis not present

## 2016-03-04 MED ORDER — AZITHROMYCIN 250 MG PO TABS: ORAL_TABLET | ORAL | 0 refills | Status: DC

## 2016-03-04 MED ORDER — LEVALBUTEROL HCL 0.63 MG/3ML IN NEBU
0.6300 mg | INHALATION_SOLUTION | Freq: Once | RESPIRATORY_TRACT | Status: AC
  Administered 2016-03-04: 0.63 mg via RESPIRATORY_TRACT

## 2016-03-04 MED ORDER — METHYLPREDNISOLONE ACETATE 80 MG/ML IJ SUSP
80.0000 mg | Freq: Once | INTRAMUSCULAR | Status: AC
  Administered 2016-03-04: 80 mg via INTRAMUSCULAR

## 2016-03-04 NOTE — Progress Notes (Signed)
HPI F never smoker followed by Dr Jamison NeighborNestor for Cough, Moderate Restrictive Lung Disease, Right Lung Nodules, & Seasonal Allergic Rhinitis.  ----------------------------------------------------------------- 03/04/2016-61 year old female patient of Dr. Jamison NeighborNestor  never smoker seen today for acute visit. Follows for: Pt stated she was congested since Wednesday of last week. On Friday she coughed up mucus with blood and went to urgent care and was told she had bronchitis was given tussinex and told to use nasal spray. Pt also states she has been wheezing, coughing.  Follow-up chest CT pending next summer Urgent [place] 1 week ago when she had increased cough/bronchitis and to streak hemoptysis. Complains her inhaler triggers cough. Right ear itches. Now denies fever, chest pain, adenopathy. Describes trace of heme in white/yellow sputum today.  ROS-see HPI   Negative unless "+" Constitutional:    weight loss, night sweats, fevers, chills, fatigue, lassitude. HEENT:    headaches, difficulty swallowing, tooth/dental problems, sore throat,       sneezing, itching, ear ache, + nasal congestion, post nasal drip, snoring CV:    chest pain, orthopnea, PND, swelling in lower extremities, anasarca,                                                        dizziness, palpitations Resp:   shortness of breath with exertion or at rest.                +productive cough,   non-productive cough, +coughing up of blood.              +change in color of mucus.  +wheezing.   Skin:    rash or lesions. GI:  No-   heartburn, indigestion, abdominal pain, nausea, vomiting, diarrhea,                 change in bowel habits, loss of appetite GU: dysuria, change in color of urine, no urgency or frequency.   flank pain. MS:   joint pain, stiffness, decreased range of motion, back pain. Neuro-     nothing unusual Psych:  change in mood or affect.  depression or anxiety.   memory loss.  OBJ- Physical Exam General-  Alert, Oriented, Affect-appropriate, Distress- none acute, + obese Skin- rash-none, lesions- none, excoriation- none Lymphadenopathy- none Head- atraumatic            Eyes- Gross vision intact, PERRLA, conjunctivae and secretions clear            Ears- Hearing, canals-normal            Nose- + turbinate edema, no-Septal dev, mucus, polyps, erosion, perforation             Throat- Mallampati II , mucosa clear , drainage- none, tonsils- atrophic Neck- flexible , trachea midline, no stridor , thyroid nl, carotid no bruit Chest - symmetrical excursion , unlabored           Heart/CV- RRR , no murmur , no gallop  , no rub, nl s1 s2                           - JVD- none , edema- none, stasis changes- none, varices- none           Lung- clear to P&A, wheeze- none, cough + loose ,  dullness-none, rub- none           Chest wall-  Abd-  Br/ Gen/ Rectal- Not done, not indicated Extrem- cyanosis- none, clubbing, none, atrophy- none, strength- nl Neuro- grossly intact to observation

## 2016-03-04 NOTE — Patient Instructions (Addendum)
Stay well- hydrated, drink warm liquids, keep yourself warm and rested. MucinexDM would be fine  Neb xop 0.63     Dx acute bronchitis  Depo 80  Script for Zpak sent   Order- CXR  Dx acute bronchitis, lung nodules  Make an appointment to follow up with Dr Jamison NeighborNestor after your next CT scan- next summer

## 2016-03-06 ENCOUNTER — Encounter: Payer: Self-pay | Admitting: Pulmonary Disease

## 2016-03-06 ENCOUNTER — Telehealth: Payer: Self-pay | Admitting: Pulmonary Disease

## 2016-03-06 ENCOUNTER — Ambulatory Visit (INDEPENDENT_AMBULATORY_CARE_PROVIDER_SITE_OTHER): Payer: Federal, State, Local not specified - PPO | Admitting: Pulmonary Disease

## 2016-03-06 VITALS — BP 114/68 | HR 71 | Ht 63.0 in | Wt 212.0 lb

## 2016-03-06 DIAGNOSIS — J302 Other seasonal allergic rhinitis: Secondary | ICD-10-CM | POA: Diagnosis not present

## 2016-03-06 DIAGNOSIS — R918 Other nonspecific abnormal finding of lung field: Secondary | ICD-10-CM

## 2016-03-06 DIAGNOSIS — J189 Pneumonia, unspecified organism: Secondary | ICD-10-CM

## 2016-03-06 MED ORDER — ALBUTEROL SULFATE HFA 108 (90 BASE) MCG/ACT IN AERS: 2.0000 | INHALATION_SPRAY | RESPIRATORY_TRACT | 3 refills | Status: AC | PRN

## 2016-03-06 MED ORDER — BENZONATATE 100 MG PO CAPS: 100.0000 mg | ORAL_CAPSULE | Freq: Three times a day (TID) | ORAL | 0 refills | Status: AC | PRN

## 2016-03-06 NOTE — Telephone Encounter (Signed)
lmtcb X1 for pt  

## 2016-03-06 NOTE — Progress Notes (Signed)
Subjective:    Patient ID: Catherine Parker, female    DOB: 12-03-1955, 61 y.o.   MRN: [id number]  C.C.:  Follow-up for Cough, Moderate Restrictive Lung Disease, Right Lung Nodules, & Chronic Seasonal Allergic Rhinitis.  HPI Community Acquired Pneumonia:  Her cough started last Wednesday and she had a cough producing a bloody mucus on Friday. Prior to that she was producing a yellow mucus. She reports she is now producing a white mucus. She is having more coughing with laying recumbent. She was prescribed azithromycin. She reports she has been taking Mucinex DM and Hydrocodone Cough Syrup without help.   Chronic Seasonal Allergic Rhinitis:  Patient utilizing nasal saline rinse. Denies any sinus congestion or drainage. She has had "popping" in her ears.   Right Lung Nodules: 2 mm nodules seen on high-resolution CT scan July 2017. Autoimmune workup negative. Planned for repeat CT imaging July 2018.   Moderate Restrictive Lung Disease:Likely secondary to obesity. No further testing at this time.   Review of Systems She reports she has had intermittent chills but no sweats. Possible subjective fever. She does report chest pain with her cough as well as a headache.  No abdominal pain or emesis. She reports she did have significant diarrhea. Her diarrhea has improved.   Allergies  Allergen Reactions  . Codeine Nausea Only  . Latex   . Penicillins   . Shellfish Allergy Hives    Current Outpatient Prescriptions on File Prior to Visit  Medication Sig Dispense Refill  . Albuterol Sulfate (PROAIR RESPICLICK) 108 (90 Base) MCG/ACT AEPB Inhale 2 puffs into the lungs every 6 (six) hours as needed. Reported on 08/20/2015    . azithromycin (ZITHROMAX) 250 MG tablet 2 today then one daily 6 tablet 0  . chlorpheniramine-HYDROcodone (TUSSIONEX) 10-8 MG/5ML SUER     . Vitamin D, Ergocalciferol, (DRISDOL) 50000 units CAPS capsule Take 1 capsule by mouth every Thursday.    . zolpidem (AMBIEN) 10 MG  tablet 1/2 tab by mouth at bedtime if needed     No current facility-administered medications on file prior to visit.     Past Medical History:  Diagnosis Date  . H/O herpes simplex type 2 infection   . Herpes simplex without mention of complication   . High cholesterol   . Hx of bilateral breast reduction surgery   . Joint ache   . OSA (obstructive sleep apnea)   . Pain in limb   . Varicose veins of lower extremities with other complications   . Vitamin deficiency     Past Surgical History:  Procedure Laterality Date  . BREAST SURGERY     breast reduction-bilat  . BUNIONECTOMY    . CARPAL TUNNEL RELEASE    . CHONDROPLASTY  03/12/2012   Procedure: CHONDROPLASTY;  Surgeon: Harvie Junior, MD;  Location: [facility];  Service: Orthopedics;  Laterality: Left;  . HAND SURGERY Left 02/06/10   Dr Teressa Senter  . KNEE ARTHROSCOPY    . KNEE ARTHROSCOPY WITH LATERAL MENISECTOMY  03/12/2012   Procedure: KNEE ARTHROSCOPY WITH LATERAL MENISECTOMY;  Surgeon: Harvie Junior, MD;  Location: [facility];  Service: Orthopedics;  Laterality: Left;  . KNEE ARTHROSCOPY WITH MEDIAL MENISECTOMY  03/12/2012   Procedure: KNEE ARTHROSCOPY WITH MEDIAL MENISECTOMY;  Surgeon: Harvie Junior, MD;  Location: [facility];  Service: Orthopedics;  Laterality: Left;    Family History  Problem Relation Age of Onset  . Diabetes Mother   . Heart  disease Mother   . Diabetes Father   . Heart disease Father   . Diabetes Sister   . Diabetes Maternal Aunt   . Heart disease Maternal Uncle   . Cancer Paternal Aunt   . Heart disease Maternal Grandmother   . Heart disease Maternal Grandfather   . Diabetes Sister   . Alcohol abuse Paternal Aunt   . Heart attack Father   . Heart attack Mother   . Asthma Mother   . Leukemia Mother     Social History   Social History  . Marital status: Single    Spouse name: N/A  . Number of children: N/A  . Years of education: N/A   Social  History Main Topics  . Smoking status: Passive Smoke Exposure - Never Smoker  . Smokeless tobacco: Never Used     Comment: Father smoked.   . Alcohol use 0.0 oz/week     Comment: socially -- wine  . Drug use: No  . Sexual activity: Yes    Partners: Male    Birth control/ protection: IUD     Comment: MIRENA   Other Topics Concern  . 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 Moved to [place] in 1993. Previously has traveled to [place] and also on cruises. 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 No pets currently. Previously owned a Adult nurseparrot around [id number] for 4 months. Does have carpet in her bedrooms. No hot tub exposure.       Objective:   Physical Exam BP 114/68 (BP Location: Left Arm, Cuff Size: Normal)   Pulse 71   Ht 5\' 3"  (1.6 m)   Wt 212 lb (96.2 kg)   SpO2 97%   BMI 37.55 kg/m  General:  Awake. No distress. Central obesity.  Integument:  Warm & dry. No rash on exposed skin.  Lymphatics:  No appreciated cervical or supraclavicular lymphadenoapthy. HEENT:  Moist mucus membranes. No nasal turbinate swelling. No oral ulcers. Cardiovascular:  Regular rate & rhythm. No edema. Normal S1 & S2.  Pulmonary: Clear with auscultation. Normal work of breathing on room air. Speaking in complete sentences. Musculoskeletal: Normal bulk and tone. No joint deformity or effusion appreciated.  PFT 10/30/15: FVC 1.63 L (63%) FEV1 1.43 L (70%) FEV1/FVC 2.16 L (106%)                                                                                                                                            DLCO corrected 67% (hgb 13.5) 09/06/15: FVC 1.67 L (64%) FEV1 1.45 L (71%) FEV1/FVC 0.87 FEF 25-75 1.93 L (94%) negative bronchodilator response TLC 2.96 L (60%) RV 65% ERV 86% DLCO corrected 61% (hemoglobin 13.6)  6MWT 10/30/15:  Walked 489 meters / Baseline Sat 99%  on RA / Nadir sat 94% on RA  IMAGING CXR PA/LAT  03/06/16 (personally reviewed by me):  Patchy bilateral basilar opacities. No pleural effusion appreciated. Heart normal in size & mediastinum normal in contour.  CT CHEST W/O 09/05/15 (previously reviewed by me): 2 mm subpleural pulmonary nodules noted within the right lung. No developing parenchymal mass. No pleural effusion or thickening. No pericardial effusion. No pathologic mediastinal adenopathy.  CXR PA/LAT 08/20/15 (previously reviewed by me): Low lung volumes. Radiologist commented on an interstitial prominence as well as cardiomegaly. However, I do not appreciate cardiomegaly and suspect interstitial prominence is due to poor inspiratory effort. No focal opacity or pleural effusion appreciated. Mediastinum normal in contour.  CARDIAC TTE (10/04/15): LV normal in size with EF 55-60% grade 1 diastolic dysfunction. LA & RA normal in size. RV normal in size and function. Pulmonary artery systolic pressure 20 mmHg. No aortic stenosis or regurgitation. Aortic root normal in size. No mitral stenosis or regurgitation. No pulmonic regurgitation. No pericardial effusion.  LABS 09/25/15 ANA: Negative Anti-CCP:  <16 RF:  <10 SSA:  <1.0 SSB:  <1.0  08/20/15 CBC: 9.6/12.9/3.0/318 Eosinophils: 0.5. IgE: 65 RAST Panel: [place] 0.13    Assessment & Plan:  61 y.o. with previous cough that resolved. Patient's x-ray performed on 1/2 does show a very mild basilar predominant pneumonia. With improvement in the patient's mucus volume and color I do not feel alternative antibiotic therapy is necessary at this time. In the absence of wheezing I don't feel that further steroid therapy is necessary. I am going to attempt further cough suppression with Tessalon Perles and albuterol inhaled. I instructed the patient to contact my office if she had any new breathing problems or felt she was clinically worsening before her next appointment.   1. CAP:  Patient complaining course of azithromycin. Albuterol inhaler &  Tessalon Perles for cough suppression.Repeat chest x-ray PA/LAT and next appointment to ensure resolution of opacities.  2. Right Lung Nodules: Repeat CT chest without contrast July 2018. 3. Chronic Seasonal Allergic Rhinitis: Recommended continuing nasal saline rinse daily. 4. Moderate Restrictive Lung Disease: Likely secondary to obesity. no further testing necessary.  5. Health Maintenance:  S/P Pneumovax 24 November 2015 & Influenza Vaccine November 2017. Plan for Prevnar Vaccine September 2018. 6. Follow-up: Return to clinic in 4 weeks or sooner if needed.  Donna Christen Jamison Neighbor, M.D. [facility] Pulmonary & Critical Care Pager:  [phone] After 3pm or if no response, call [phone] 11:56 AM 03/06/16

## 2016-03-06 NOTE — Patient Instructions (Addendum)
   Call me if you don't feel you are getting any better.  Use the Albuterol inhaler we are sending in for the next couple of days - 2 puffs three times a day to help prevent the cough.  We will see you back in 4 weeks to make sure you are getting better.  TESTS ORDERED: 1. CXR PA/LAT at follow-up appointment

## 2016-03-07 ENCOUNTER — Telehealth: Payer: Self-pay

## 2016-03-07 MED ORDER — DOXYCYCLINE HYCLATE 100 MG PO TABS: 100.0000 mg | ORAL_TABLET | Freq: Two times a day (BID) | ORAL | 0 refills | Status: DC

## 2016-03-07 NOTE — Telephone Encounter (Signed)
This letter was typed up yesterday in JN office visit- I did not work with Paula LibraJN yesterday, but the letter is available for reprint in pt chart under "letters" tab.   I did not fax letter yesterday as per triage protocol I need to verify fax number given with patient before this is sent.   Thanks!

## 2016-03-07 NOTE — Telephone Encounter (Signed)
Pt calling back [phone]

## 2016-03-07 NOTE — Telephone Encounter (Signed)
Called and spoke to pt. Pt questioning if JN has responded yet. Advised her he has not responded yet because he is in clinic and will answer as soon as he is available. Advised pt that if her s/s were to worsen and she cannot wait for a response to then seek emergency care. Pt aware and will wait JN's response.

## 2016-03-07 NOTE — Telephone Encounter (Signed)
Left message on pt. vm informing her that her letter was faxed.

## 2016-03-07 NOTE — Telephone Encounter (Signed)
Catherine Parker   Pt. Returned call I did not see where you documented if you faxed this note. But did verify fax number and the person she needs this faxed to and they are correct.

## 2016-03-07 NOTE — Addendum Note (Signed)
Addended by: Sheran LuzEAST, Myasia Sinatra K on: 03/07/2016 01:01 PM   Modules accepted: Orders

## 2016-03-07 NOTE — Telephone Encounter (Signed)
Please start her on Doxycycline 100mg  po bid x 10 days. She should avoid taking it with any dairy products. She should take it with a full glass of water and remain upright for 1 hour afterward. This make work better for her than the Z-pack. Thanks.

## 2016-03-07 NOTE — Telephone Encounter (Signed)
JN  Please Advise- Sick Message  Pt. Was placed on a z pack on 03/04/16 she is now on the 4th day. Pt. C/o when she blows her nose she saw blood mixed in, and she has green mucus when she coughs, still having sob, unable to lay flat, Denies fever, chest pain or tightness. She wants to know what should she do further.  Allergies  Allergen Reactions  . Codeine Nausea Only  . Latex   . Penicillins   . Shellfish Allergy Hives

## 2016-03-07 NOTE — Telephone Encounter (Signed)
Called and spoke to pt. Informed her of the recs per JN. Rx sent to preferred pharmacy. Pt verbalized understanding and denied any further questions or concerns at this time.   

## 2016-03-09 DIAGNOSIS — R042 Hemoptysis: Secondary | ICD-10-CM | POA: Insufficient documentation

## 2016-03-09 NOTE — Assessment & Plan Note (Signed)
Follow-up chest CT is scheduled for future visit. She will keep this as discussed.

## 2016-03-09 NOTE — Assessment & Plan Note (Signed)
Available inflammation consistent with acute benign hemorrhagic bronchitis associated with acute respiratory infection. Uncertain relation to known lung nodules.  Plan-CXR, Z-Pak, nebulizer treatment Xopenex, Depo-Medrol

## 2016-03-10 NOTE — Telephone Encounter (Signed)
Faxed letter. Called and spoke to pt. Informed her the letter has been faxed, pt requested letter to be faxed at [phone]. Pt verbalized understanding and denied any further questions or concerns at this time.

## 2016-04-07 ENCOUNTER — Ambulatory Visit (INDEPENDENT_AMBULATORY_CARE_PROVIDER_SITE_OTHER): Payer: Federal, State, Local not specified - PPO | Admitting: Pulmonary Disease

## 2016-04-07 ENCOUNTER — Ambulatory Visit (INDEPENDENT_AMBULATORY_CARE_PROVIDER_SITE_OTHER)
Admission: RE | Admit: 2016-04-07 | Discharge: 2016-04-07 | Disposition: A | Discharge status: Home / Self Care | Payer: Federal, State, Local not specified - PPO | Source: Ambulatory Visit | Attending: Pulmonary Disease | Admitting: Pulmonary Disease

## 2016-04-07 ENCOUNTER — Encounter: Payer: Self-pay | Admitting: Pulmonary Disease

## 2016-04-07 VITALS — BP 120/80 | HR 77 | Ht 63.0 in | Wt 213.2 lb

## 2016-04-07 DIAGNOSIS — J302 Other seasonal allergic rhinitis: Secondary | ICD-10-CM

## 2016-04-07 DIAGNOSIS — J189 Pneumonia, unspecified organism: Secondary | ICD-10-CM | POA: Diagnosis not present

## 2016-04-07 DIAGNOSIS — R918 Other nonspecific abnormal finding of lung field: Secondary | ICD-10-CM

## 2016-04-07 NOTE — Patient Instructions (Signed)
   Call me if you have any new breathing problems before your next appointment.  We will call you with the results of your x-ray.  You cough should continue to improve and completely go away.  I will see you back in July after your CT scan.  TESTS ORDERED: 1. CXR PA/LAT today

## 2016-04-07 NOTE — Progress Notes (Signed)
Subjective:    Patient ID: Catherine Parker, female    DOB: 1955/03/06, 61 y.o.   MRN: [id number]  C.C.:  Follow-up for Community Acquired Pneumonia, Right Lung Nodules, & Chronic Seasonal Allergic Rhinitis.  HPI Community Acquired Pneumonia:  Patient treated with azithromycin at last appointment. She reports she is feeling "a lot" better. She reports her nonproductive cough has returned. Denies any fever, chills, or sweats. She reports her cough is helped by the [employer] Perles.   Chronic Seasonal Allergic Rhinitis:  Instructed to use nasal saline rinse at last appointment. No sinus congestion, pressure, or drainage.   Right Lung Nodules: 2 mm nodules seen on high-resolution CT scan July 2017. Autoimmune workup negative. continuing to plan for repeat CT imaging in July 2018.  Review of Systems No chest pain, tightness, or pressure. No abdominal pain or nausea. No diarrhea. No headaches, sore throat, or vision changes.   Allergies  Allergen Reactions  . Codeine Nausea Only  . Latex   . Oxycodone   . Penicillins   . Shellfish Allergy Hives    Current Outpatient Prescriptions on File Prior to Visit  Medication Sig Dispense Refill  . albuterol (PROVENTIL HFA;VENTOLIN HFA) 108 (90 Base) MCG/ACT inhaler Inhale 2 puffs into the lungs every 4 (four) hours as needed for wheezing or shortness of breath. 1 Inhaler 3  . benzonatate (TESSALON PERLES) 100 MG capsule Take 1-2 capsules (100-200 mg total) by mouth 3 (three) times daily as needed for cough. 90 capsule 0  . zolpidem (AMBIEN) 10 MG tablet 1/2 tab by mouth at bedtime if needed    . chlorpheniramine-HYDROcodone (TUSSIONEX) 10-8 MG/5ML SUER     . doxycycline (VIBRA-TABS) 100 MG tablet Take 1 tablet (100 mg total) by mouth 2 (two) times daily. (Patient not taking: Reported on 04/07/2016) 20 tablet 0  . Vitamin D, Ergocalciferol, (DRISDOL) 50000 units CAPS capsule Take 1 capsule by mouth every Thursday.     No current  facility-administered medications on file prior to visit.     Past Medical History:  Diagnosis Date  . H/O herpes simplex type 2 infection   . Herpes simplex without mention of complication   . High cholesterol   . Hx of bilateral breast reduction surgery   . Joint ache   . OSA (obstructive sleep apnea)   . Pain in limb   . Varicose veins of lower extremities with other complications   . Vitamin deficiency     Past Surgical History:  Procedure Laterality Date  . BREAST SURGERY     breast reduction-bilat  . BUNIONECTOMY    . CARPAL TUNNEL RELEASE    . CHONDROPLASTY  03/12/2012   Procedure: CHONDROPLASTY;  Surgeon: Harvie JuniorJohn L Graves, MD;  Location: [facility];  Service: Orthopedics;  Laterality: Left;  . HAND SURGERY Left 02/06/10   Dr Teressa SenterSypher  . KNEE ARTHROSCOPY    . KNEE ARTHROSCOPY WITH LATERAL MENISECTOMY  03/12/2012   Procedure: KNEE ARTHROSCOPY WITH LATERAL MENISECTOMY;  Surgeon: Harvie JuniorJohn L Graves, MD;  Location: [facility];  Service: Orthopedics;  Laterality: Left;  . KNEE ARTHROSCOPY WITH MEDIAL MENISECTOMY  03/12/2012   Procedure: KNEE ARTHROSCOPY WITH MEDIAL MENISECTOMY;  Surgeon: Harvie JuniorJohn L Graves, MD;  Location: [facility];  Service: Orthopedics;  Laterality: Left;    Family History  Problem Relation Age of Onset  . Diabetes Mother   . Heart disease Mother   . Diabetes Father   . Heart disease Father   .  Diabetes Sister   . Diabetes Maternal Aunt   . Heart disease Maternal Uncle   . Cancer Paternal Aunt   . Heart disease Maternal Grandmother   . Heart disease Maternal Grandfather   . Diabetes Sister   . Alcohol abuse Paternal Aunt   . Heart attack Father   . Heart attack Mother   . Asthma Mother   . Leukemia Mother     Social History   Social History  . Marital status: Single    Spouse name: N/A  . Number of children: N/A  . Years of education: N/A   Social History Main Topics  . Smoking status: Passive Smoke Exposure  - Never Smoker  . Smokeless tobacco: Never Used     Comment: Father smoked.   . Alcohol use 0.0 oz/week     Comment: socially -- wine  . Drug use: No  . Sexual activity: Yes    Partners: Male    Birth control/ protection: IUD     Comment: MIRENA   Other Topics Concern  . None   Social History Narrative   Single   Research officer, political party   No recent travel      Adult nurse Pulmonary:      Originally from [place]. Moved to  in 1993. Previously has traveled to [place] and also on cruises. 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 Previously owned a Adult nurse around 5 for 4 months. Does have carpet in her bedrooms. No hot tub exposure.       Objective:   Physical Exam BP 120/80 (BP Location: Right Arm, Patient Position: Sitting, Cuff Size: Normal)   Pulse 77   Ht 5\' 3"  (1.6 m)   Wt 213 lb 3.2 oz (96.7 kg)   SpO2 96%   BMI 37.77 kg/m   General:  Awake. Alert. Mild central obesity.  Integument:  Warm & dry. No rash on exposed skin.  Extremities:  No cyanosis or clubbing.  HEENT:  Moist mucus membranes. Minimal nasal turbinate swelling. No scleral icterus. Cardiovascular:  Regular rate. No edema. Normal S1 & S2. Pulmonary:  Slightly decreased breath sounds in the bases but otherwise good aeration bilaterally. Normal work of breathing on room air. Speaking in complete sentences. Abdomen: Soft. Normal bowel sounds. Mildly protuberant. Musculoskeletal:  Normal bulk and tone. No joint deformity or effusion appreciated.  PFT 10/30/15: FVC 1.63 L (63%) FEV1 1.43 L (70%) FEV1/FVC 2.16 L (106%)                                                                                                                                            DLCO corrected 67% (hgb 13.5) 09/06/15: FVC 1.67 L (64%) FEV1 1.45 L (71%) FEV1/FVC 0.87 FEF 25-75 1.93 L (94%) negative bronchodilator response TLC 2.96 L (60%) RV 65% ERV 86% DLCO corrected 61% (hemoglobin 13.6)  10/30/15:  Walked 489 meters /  Baseline Sat  99% on RA / Nadir sat 94% on RA  IMAGING CXR PA/LAT 03/04/16 (previously reviewed by me):  Patchy bilateral basilar opacities. No pleural effusion appreciated. Heart normal in size & mediastinum normal in contour.  CT CHEST W/O 09/05/15 (previously reviewed by me): 2 mm subpleural pulmonary nodules noted within the right lung. No developing parenchymal mass. No pleural effusion or thickening. No pericardial effusion. No pathologic mediastinal adenopathy.  CXR PA/LAT 08/20/15 (previously reviewed by me): Low lung volumes. Radiologist commented on an interstitial prominence as well as cardiomegaly. However, I do not appreciate cardiomegaly and suspect interstitial prominence is due to poor inspiratory effort. No focal opacity or pleural effusion appreciated. Mediastinum normal in contour.  CARDIAC TTE (10/04/15): LV normal in size with EF 55-60% grade 1 diastolic dysfunction. LA & RA normal in size. RV normal in size and function. Pulmonary artery systolic pressure 20 mmHg. No aortic stenosis or regurgitation. Aortic root normal in size. No mitral stenosis or regurgitation. No pulmonic regurgitation. No pericardial effusion.  LABS 09/25/15 ANA: Negative Anti-CCP:  <16 RF:  <10 SSA:  <1.0 SSB:  <1.0  08/20/15 CBC: 9.6/12.9/3.0/318 Eosinophils: 0.5. IgE: 65 RAST Panel: [place] 0.13    Assessment & Plan:  61 y.o. with community acquired pneumonia as well as right lung nodules and chronic seasonal allergic rhinitis. Patient has restrictive lung disease secondary to obesity. Patient seems to have recovered well from her previous pneumonia. We will need to reassess her lung opacities with x-ray imaging. She is planned for CT imaging to follow-up on her right lung nodules in July. I encouraged her to keep her follow-up appointment and contact me for any new breathing problems that should occur before then.   1. CAP:  Holding off on further antibiotics. Continue cough suppression with Tessalon Perles.  Repeat chest x-ray PA/LAT today. 2. Right Lung Nodules: Plan for a repeat CT chest without contrast in July 2018. 3. Health Maintenance:  S/P Pneumovax 24 November 2015 & Influenza Vaccine November 2017. Plan for Prevnar Vaccine September 2018. 4. Follow-up: Return to clinic in July after CT scan.   Donna Christen Jamison Neighbor, M.D. [facility] Pulmonary & Critical Care Pager:  [phone] After 3pm or if no response, call [phone] 5:06 PM 04/07/16

## 2016-04-09 ENCOUNTER — Ambulatory Visit: Payer: Federal, State, Local not specified - PPO | Admitting: Pulmonary Disease

## 2016-08-06 ENCOUNTER — Ambulatory Visit (INDEPENDENT_AMBULATORY_CARE_PROVIDER_SITE_OTHER): Payer: Federal, State, Local not specified - PPO | Admitting: Licensed Clinical Social Worker

## 2016-08-06 DIAGNOSIS — F331 Major depressive disorder, recurrent, moderate: Secondary | ICD-10-CM | POA: Diagnosis not present

## 2016-08-14 ENCOUNTER — Other Ambulatory Visit: Payer: Self-pay | Admitting: Pulmonary Disease

## 2016-08-14 DIAGNOSIS — R918 Other nonspecific abnormal finding of lung field: Secondary | ICD-10-CM

## 2016-08-28 ENCOUNTER — Ambulatory Visit (INDEPENDENT_AMBULATORY_CARE_PROVIDER_SITE_OTHER): Payer: Federal, State, Local not specified - PPO | Admitting: Licensed Clinical Social Worker

## 2016-08-28 DIAGNOSIS — F331 Major depressive disorder, recurrent, moderate: Secondary | ICD-10-CM | POA: Diagnosis not present

## 2016-09-19 ENCOUNTER — Ambulatory Visit (INDEPENDENT_AMBULATORY_CARE_PROVIDER_SITE_OTHER)
Admission: RE | Admit: 2016-09-19 | Discharge: 2016-09-19 | Disposition: A | Discharge status: Home / Self Care | Payer: Federal, State, Local not specified - PPO | Source: Ambulatory Visit | Attending: Pulmonary Disease | Admitting: Pulmonary Disease

## 2016-09-19 ENCOUNTER — Encounter (INDEPENDENT_AMBULATORY_CARE_PROVIDER_SITE_OTHER): Payer: Self-pay

## 2016-09-19 DIAGNOSIS — R918 Other nonspecific abnormal finding of lung field: Secondary | ICD-10-CM

## 2016-09-23 ENCOUNTER — Ambulatory Visit (INDEPENDENT_AMBULATORY_CARE_PROVIDER_SITE_OTHER): Payer: Federal, State, Local not specified - PPO | Admitting: Pulmonary Disease

## 2016-09-23 ENCOUNTER — Encounter: Payer: Self-pay | Admitting: Pulmonary Disease

## 2016-09-23 ENCOUNTER — Ambulatory Visit: Payer: Federal, State, Local not specified - PPO | Admitting: Pulmonary Disease

## 2016-09-23 VITALS — BP 140/90 | HR 65 | Ht 63.0 in | Wt 204.0 lb

## 2016-09-23 DIAGNOSIS — R918 Other nonspecific abnormal finding of lung field: Secondary | ICD-10-CM | POA: Diagnosis not present

## 2016-09-23 DIAGNOSIS — J302 Other seasonal allergic rhinitis: Secondary | ICD-10-CM | POA: Diagnosis not present

## 2016-09-23 MED ORDER — FLUTICASONE PROPIONATE 50 MCG/ACT NA SUSP: 1.0000 | Freq: Two times a day (BID) | NASAL | 3 refills | Status: AC

## 2016-09-23 NOTE — Patient Instructions (Signed)
   Try restarting your nasal saline rinse twice daily.  We are sending in a prescription for Flonase to your pharmacy. Use this after your nasal saline. Remember to angle the tip just like we discussed to keep it from shooting down your throat.  Let me know if you develop any new breathing problems as I would be happy to see you back.  Remember to get your Prevnar 13 vaccine in September and Flu shot about 4 weeks thereafter.

## 2016-09-23 NOTE — Progress Notes (Deleted)
Subjective:    Patient ID: Catherine Parker, female    DOB: [birth year], 61 y.o.   MRN: [id number]  C.C.:  Follow-up for Community Acquired Pneumonia, Right Lung Nodules, & Chronic Seasonal Allergic Rhinitis.  HPI Community Acquired Pneumonia:  Patient treated with azithromycin at last appointment. She reports she is feeling "a lot" better. She reports her nonproductive cough has returned. Denies any fever, chills, or sweats. She reports her cough is helped by the [employer] Perles.   Chronic Seasonal Allergic Rhinitis:  Instructed to use nasal saline rinse at last appointment. No sinus congestion, pressure, or drainage.   Right Lung Nodules: 2 mm nodules seen on high-resolution CT scan July 2017. Autoimmune workup negative. continuing to plan for repeat CT imaging in July 2018.  Review of Systems No chest pain, tightness, or pressure. No abdominal pain or nausea. No diarrhea. No headaches, sore throat, or vision changes.   Allergies  Allergen Reactions  . Codeine Nausea Only  . Latex   . Oxycodone   . Penicillins   . Shellfish Allergy Hives    Current Outpatient Prescriptions on File Prior to Visit  Medication Sig Dispense Refill  . albuterol (PROVENTIL HFA;VENTOLIN HFA) 108 (90 Base) MCG/ACT inhaler Inhale 2 puffs into the lungs every 4 (four) hours as needed for wheezing or shortness of breath. 1 Inhaler 3  . benzonatate (TESSALON PERLES) 100 MG capsule Take 1-2 capsules (100-200 mg total) by mouth 3 (three) times daily as needed for cough. 90 capsule 0  . chlorpheniramine-HYDROcodone (TUSSIONEX) 10-8 MG/5ML SUER     . doxycycline (VIBRA-TABS) 100 MG tablet Take 1 tablet (100 mg total) by mouth 2 (two) times daily. (Patient not taking: Reported on 04/07/2016) 20 tablet 0  . Vitamin D, Ergocalciferol, (DRISDOL) 50000 units CAPS capsule Take 1 capsule by mouth every Thursday.    . zolpidem (AMBIEN) 10 MG tablet 1/2 tab by mouth at bedtime if needed     No current  facility-administered medications on file prior to visit.     Past Medical History:  Diagnosis Date  . H/O herpes simplex type 2 infection   . Herpes simplex without mention of complication   . High cholesterol   . Hx of bilateral breast reduction surgery   . Joint ache   . OSA (obstructive sleep apnea)   . Pain in limb   . Varicose veins of lower extremities with other complications   . Vitamin deficiency     Past Surgical History:  Procedure Laterality Date  . BREAST SURGERY     breast reduction-bilat  . BUNIONECTOMY    . CARPAL TUNNEL RELEASE    . CHONDROPLASTY  03/12/2012   Procedure: CHONDROPLASTY;  Surgeon: Harvie JuniorJohn L Graves, MD;  Location: [facility];  Service: Orthopedics;  Laterality: Left;  . HAND SURGERY Left 02/06/10   Dr Teressa SenterSypher  . KNEE ARTHROSCOPY    . KNEE ARTHROSCOPY WITH LATERAL MENISECTOMY  03/12/2012   Procedure: KNEE ARTHROSCOPY WITH LATERAL MENISECTOMY;  Surgeon: Harvie JuniorJohn L Graves, MD;  Location: [facility];  Service: Orthopedics;  Laterality: Left;  . KNEE ARTHROSCOPY WITH MEDIAL MENISECTOMY  03/12/2012   Procedure: KNEE ARTHROSCOPY WITH MEDIAL MENISECTOMY;  Surgeon: Harvie JuniorJohn L Graves, MD;  Location: [facility];  Service: Orthopedics;  Laterality: Left;    Family History  Problem Relation Age of Onset  . Diabetes Mother   . Heart disease Mother   . Diabetes Father   . Heart disease Father   .  Diabetes Sister   . Diabetes Maternal Aunt   . Heart disease Maternal Uncle   . Cancer Paternal Aunt   . Heart disease Maternal Grandmother   . Heart disease Maternal Grandfather   . Diabetes Sister   . Alcohol abuse Paternal Aunt   . Heart attack Father   . Heart attack Mother   . Asthma Mother   . Leukemia Mother     Social History   Social History  . Marital status: Single    Spouse name: N/A  . Number of children: N/A  . Years of education: N/A   Social History Main Topics  . Smoking status: Passive Smoke Exposure  - Never Smoker  . Smokeless tobacco: Never Used     Comment: Father smoked.   . Alcohol use 0.0 oz/week     Comment: socially -- wine  . Drug use: No  . Sexual activity: Yes    Partners: Male    Birth control/ protection: IUD     Comment: MIRENA   Other Topics Concern  . Not on file   Social History Narrative   Single   Research officer, political party   No recent travel      Adult nurse Pulmonary:      Originally from [place]. Moved to [place] in 1993. Previously has traveled to [place] and also on cruises. 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 Previously owned a Adult nurse around 89 for 4 months. Does have carpet in her bedrooms. No hot tub exposure.       Objective:   Physical Exam There were no vitals taken for this visit.  General:  Awake. Alert. Mild central obesity.  Integument:  Warm & dry. No rash on exposed skin.  Extremities:  No cyanosis or clubbing.  HEENT:  Moist mucus membranes. Minimal nasal turbinate swelling. No scleral icterus. Cardiovascular:  Regular rate. No edema. Normal S1 & S2. Pulmonary:  Slightly decreased breath sounds in the bases but otherwise good aeration bilaterally. Normal work of breathing on room air. Speaking in complete sentences. Abdomen: Soft. Normal bowel sounds. Mildly protuberant. Musculoskeletal:  Normal bulk and tone. No joint deformity or effusion appreciated.  PFT 10/30/15: FVC 1.63 L (63%) FEV1 1.43 L (70%) FEV1/FVC 2.16 L (106%)                                                                                                                                            DLCO corrected 67% (hgb 13.5) 09/06/15: FVC 1.67 L (64%) FEV1 1.45 L (71%) FEV1/FVC 0.87 FEF 25-75 1.93 L (94%) negative bronchodilator response TLC 2.96 L (60%) RV 65% ERV 86% DLCO corrected 61% (hemoglobin 13.6)  10/30/15:  Walked 489 meters / Baseline Sat 99% on RA / Nadir sat 94% on RA  IMAGING CXR PA/LAT 03/04/16 (previously reviewed by me):  Patchy bilateral basilar  opacities. No pleural effusion appreciated. Heart  normal in size & mediastinum normal in contour.  CT CHEST W/O 09/05/15 (previously reviewed by me): 2 mm subpleural pulmonary nodules noted within the right lung. No developing parenchymal mass. No pleural effusion or thickening. No pericardial effusion. No pathologic mediastinal adenopathy.  CXR PA/LAT 08/20/15 (previously reviewed by me): Low lung volumes. Radiologist commented on an interstitial prominence as well as cardiomegaly. However, I do not appreciate cardiomegaly and suspect interstitial prominence is due to poor inspiratory effort. No focal opacity or pleural effusion appreciated. Mediastinum normal in contour.  CARDIAC TTE (10/04/15): LV normal in size with EF 55-60% grade 1 diastolic dysfunction. LA & RA normal in size. RV normal in size and function. Pulmonary artery systolic pressure 20 mmHg. No aortic stenosis or regurgitation. Aortic root normal in size. No mitral stenosis or regurgitation. No pulmonic regurgitation. No pericardial effusion.  LABS 09/25/15 ANA: Negative Anti-CCP:  <16 RF:  <10 SSA:  <1.0 SSB:  <1.0  08/20/15 CBC: 9.6/12.9/3.0/318 Eosinophils: 0.5. IgE: 65 RAST Panel: [place] 0.13    Assessment & Plan:  61 y.o. with community acquired pneumonia as well as right lung nodules and chronic seasonal allergic rhinitis. Patient has restrictive lung disease secondary to obesity. Patient seems to have recovered well from her previous pneumonia. We will need to reassess her lung opacities with x-ray imaging. She is planned for CT imaging to follow-up on her right lung nodules in July. I encouraged her to keep her follow-up appointment and contact me for any new breathing problems that should occur before then.   1. CAP:  Holding off on further antibiotics. Continue cough suppression with Tessalon Perles. Repeat chest x-ray PA/LAT today. 2. Right Lung Nodules: Plan for a repeat CT chest without contrast in July  2018. 3. Health Maintenance:  S/P Pneumovax 24 November 2015 & Influenza Vaccine November 2017. Plan for Prevnar Vaccine September 2018. 4. Follow-up: Return to clinic in July after CT scan.   Donna Christen Jamison Neighbor, M.D. [facility] Pulmonary & Critical Care Pager:  [phone] After 3pm or if no response, call [phone] 1:42 PM 09/23/16

## 2016-09-23 NOTE — Progress Notes (Signed)
Subjective:    Patient ID: Catherine Parker, female    DOB: 07-25-1955, 61 y.o.   MRN: [id number]  C.C.:  Follow-up for Right Lung Nodules & Chronic Seasonal Allergic Rhinitis.  HPI Right lung nodules: 2 mm nodules initially seen on high-resolution CT scan July 2017. Autoimmune workup negative. No new nodules appreciated. Patients nodules have not changed in size and there are no obvious new ones.   Chronic seasonal allergic rhinitis: She reports no sinus congestion or drainage now.   Review of Systems She reports only minimal nonproductive cough & wheezing. No fever or chills. No abdominal pain or nausea.  Allergies  Allergen Reactions  . Codeine Nausea Only  . Latex   . Oxycodone   . Penicillins   . Shellfish Allergy Hives    Current Outpatient Prescriptions on File Prior to Visit  Medication Sig Dispense Refill  . Vitamin D, Ergocalciferol, (DRISDOL) 50000 units CAPS capsule Take 1 capsule by mouth every Thursday.    Marland Kitchen. albuterol (PROVENTIL HFA;VENTOLIN HFA) 108 (90 Base) MCG/ACT inhaler Inhale 2 puffs into the lungs every 4 (four) hours as needed for wheezing or shortness of breath. (Patient not taking: Reported on 09/23/2016) 1 Inhaler 3  . benzonatate (TESSALON PERLES) 100 MG capsule Take 1-2 capsules (100-200 mg total) by mouth 3 (three) times daily as needed for cough. (Patient not taking: Reported on 09/23/2016) 90 capsule 0  . chlorpheniramine-HYDROcodone (TUSSIONEX) 10-8 MG/5ML SUER     . zolpidem (AMBIEN) 10 MG tablet 1/2 tab by mouth at bedtime if needed     No current facility-administered medications on file prior to visit.     Past Medical History:  Diagnosis Date  . H/O herpes simplex type 2 infection   . Herpes simplex without mention of complication   . High cholesterol   . Hx of bilateral breast reduction surgery   . Joint ache   . OSA (obstructive sleep apnea)   . Pain in limb   . Varicose veins of lower extremities with other complications   .  Vitamin deficiency     Past Surgical History:  Procedure Laterality Date  . BREAST SURGERY     breast reduction-bilat  . BUNIONECTOMY    . CARPAL TUNNEL RELEASE    . CHONDROPLASTY  03/12/2012   Procedure: CHONDROPLASTY;  Surgeon: Harvie JuniorJohn L Graves, MD;  Location: [facility];  Service: Orthopedics;  Laterality: Left;  . HAND SURGERY Left 02/06/10   Dr Teressa SenterSypher  . KNEE ARTHROSCOPY    . KNEE ARTHROSCOPY WITH LATERAL MENISECTOMY  03/12/2012   Procedure: KNEE ARTHROSCOPY WITH LATERAL MENISECTOMY;  Surgeon: Harvie JuniorJohn L Graves, MD;  Location: [facility];  Service: Orthopedics;  Laterality: Left;  . KNEE ARTHROSCOPY WITH MEDIAL MENISECTOMY  03/12/2012   Procedure: KNEE ARTHROSCOPY WITH MEDIAL MENISECTOMY;  Surgeon: Harvie JuniorJohn L Graves, MD;  Location: [facility];  Service: Orthopedics;  Laterality: Left;    Family History  Problem Relation Age of Onset  . Diabetes Mother   . Heart disease Mother   . Diabetes Father   . Heart disease Father   . Diabetes Sister   . Diabetes Maternal Aunt   . Heart disease Maternal Uncle   . Cancer Paternal Aunt   . Heart disease Maternal Grandmother   . Heart disease Maternal Grandfather   . Diabetes Sister   . Alcohol abuse Paternal Aunt   . Heart attack Father   . Heart attack Mother   . Asthma Mother   .  Leukemia Mother     Social History   Social History  . Marital status: Single    Spouse name: N/A  . Number of children: N/A  . Years of education: N/A   Social History Main Topics  . Smoking status: Passive Smoke Exposure - Never Smoker  . Smokeless tobacco: Never Used     Comment: Father smoked.   . Alcohol use 0.0 oz/week     Comment: socially -- wine  . Drug use: No  . Sexual activity: Yes    Partners: Male    Birth control/ protection: IUD     Comment: MIRENA   Other Topics Concern  . None   Social History Narrative   Single   Research officer, political party   No recent travel      Adult nurse Pulmonary:       Originally from [place]. Moved to [place] in 1993. Previously has traveled to [place] and also on cruises. 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 Previously owned a Adult nurse around 12 for 4 months. Does have carpet in her bedrooms. No hot tub exposure.       Objective:   Physical Exam BP 140/90 (BP Location: Right Arm, Patient Position: Sitting, Cuff Size: Large)   Pulse 65   Ht 5\' 3"  (1.6 m)   Wt 204 lb (92.5 kg)   SpO2 97%   BMI 36.14 kg/m   General:  Awake. No distress. Central obesity. Integument:  Warm & dry. No rash on exposed skin.  Extremities:  No cyanosis or clubbing.  HEENT:  Moist mucus membranes. Mild bilateral nasal turbinate swelling with pale mucosa. No oral ulcers. Cardiovascular:  Regular rate. No edema. Normal S1 & S2.  Pulmonary:  Good aeration & clear to auscultation bilaterally. Symmetric chest wall expansion. No accessory muscle use on room air. Abdomen: Soft. Normal bowel sounds. Protuberant. Musculoskeletal:  Normal bulk and tone. No joint deformity or effusion appreciated.  PFT 10/30/15: FVC 1.63 L (63%) FEV1 1.43 L (70%) FEV1/FVC 0.87 FEF 25-75 2.16 L (106%)                                                                                                                   DLCO corrected 67% (hgb 13.5) 09/06/15: FVC 1.67 L (64%) FEV1 1.45 L (71%) FEV1/FVC 0.87 FEF 25-75 1.93 L (94%) negative bronchodilator response TLC 2.96 L (60%) RV 65% ERV 86% DLCO corrected 61% (hemoglobin 13.6)  10/30/15:  Walked 489 meters / Baseline Sat 99% on RA / Nadir sat 94% on RA  IMAGING CT CHEST W/O 09/19/16 (personally reviewed by me):  No pathologic mediastinal adenopathy. No pleural effusion or thickening. No pericardial effusion. Approximately 3 mm left lower lobe nodule, 2 mm right upper lobe nodule, and additional 3-4 millimeter right lower lobe nodules as well. No other parenchymal mass or opacity appreciated. These nodules appear relatively unchanged when compared with  previous CT imaging. No obvious new nodule appreciated.  CXR PA/LAT 03/04/16 (previously reviewed by me):  Patchy bilateral  basilar opacities. No pleural effusion appreciated. Heart normal in size & mediastinum normal in contour.  CT CHEST W/O 09/05/15 (previously reviewed by me): 2 mm subpleural pulmonary nodules noted within the right lung. No developing parenchymal mass. No pleural effusion or thickening. No pericardial effusion. No pathologic mediastinal adenopathy.  CXR PA/LAT 08/20/15 (previously reviewed by me): Low lung volumes. Radiologist commented on an interstitial prominence as well as cardiomegaly. However, I do not appreciate cardiomegaly and suspect interstitial prominence is due to poor inspiratory effort. No focal opacity or pleural effusion appreciated. Mediastinum normal in contour.  CARDIAC TTE (10/04/15): LV normal in size with EF 55-60% grade 1 diastolic dysfunction. LA & RA normal in size. RV normal in size and function. Pulmonary artery systolic pressure 20 mmHg. No aortic stenosis or regurgitation. Aortic root normal in size. No mitral stenosis or regurgitation. No pulmonic regurgitation. No pericardial effusion.  LABS 09/25/15 ANA: Negative Anti-CCP:  <16 RF:  <10 SSA:  <1.0 SSB:  <1.0  08/20/15 CBC: 9.6/12.9/3.0/318 Eosinophils: 0.5. IgE: 65 RAST Panel: [place] 0.13    Assessment & Plan:  61 y.o. female with incidentally found bilateral lung nodules along with chronic seasonal allergic rhinitis. These nodules have not appreciably increased in size since previous imaging and there do not appear to be any new nodules either. Lengthy discussion with the patient regarding her lung nodules as reviewed the multiple sub-centimeter lung nodules on her CT scans. Given the patient's low risk of lung cancer as per current guidelines she wishes to defer further chest CT imaging. I feel this is quite reasonable. I believe she would benefit from intranasal corticosteroid use and  nasal saline rinse for treatment of her allergic rhinitis. I instructed the patient to contact me if she developed any new breathing problems or questions as I would be happy to see her back.   1. Bilateral lung nodules: Low probability for malignancy. Patient wishes to defer further chest imaging. 2. Chronic seasonal allergic rhinitis: Recommended twice daily nasal saline rinse followed by Flonase. Instructed on proper technique. 3. Health maintenance: Status post Pneumovax September 2017. Recommended Prevnar vaccine in September 2018. 4. Follow-up: Return to clinic as needed.  Donna Christen Jamison Neighbor, M.D. [facility] Pager:  [phone] After 3pm or if no response, call [phone] 5:48 PM 09/23/16

## 2016-09-25 ENCOUNTER — Ambulatory Visit: Payer: Self-pay | Admitting: Licensed Clinical Social Worker

## 2016-10-01 ENCOUNTER — Ambulatory Visit: Payer: Federal, State, Local not specified - PPO | Admitting: Pulmonary Disease

## 2017-01-28 ENCOUNTER — Ambulatory Visit (INDEPENDENT_AMBULATORY_CARE_PROVIDER_SITE_OTHER): Payer: Federal, State, Local not specified - PPO | Admitting: Licensed Clinical Social Worker

## 2017-01-28 DIAGNOSIS — F331 Major depressive disorder, recurrent, moderate: Secondary | ICD-10-CM

## 2017-02-02 ENCOUNTER — Ambulatory Visit: Payer: Federal, State, Local not specified - PPO | Admitting: Licensed Clinical Social Worker

## 2017-02-04 ENCOUNTER — Ambulatory Visit: Payer: Federal, State, Local not specified - PPO | Admitting: Licensed Clinical Social Worker
